# Patient Record
Sex: Female | Born: 1940 | Race: Black or African American | Hispanic: No | Smoking: Former smoker
Health system: Southern US, Community
[De-identification: ages and names within clinical notes are randomized; demographics above are authoritative.]

## PROBLEM LIST (undated history)

## (undated) DIAGNOSIS — C189 Malignant neoplasm of colon, unspecified: Secondary | ICD-10-CM

## (undated) DIAGNOSIS — N183 Chronic kidney disease, stage 3 unspecified: Secondary | ICD-10-CM

## (undated) DIAGNOSIS — E119 Type 2 diabetes mellitus without complications: Secondary | ICD-10-CM

## (undated) DIAGNOSIS — E875 Hyperkalemia: Secondary | ICD-10-CM

## (undated) DIAGNOSIS — I10 Essential (primary) hypertension: Secondary | ICD-10-CM

## (undated) DIAGNOSIS — M1990 Unspecified osteoarthritis, unspecified site: Secondary | ICD-10-CM

## (undated) HISTORY — PX: ABDOMINAL HYSTERECTOMY: SHX81

## (undated) HISTORY — DX: Type 2 diabetes mellitus without complications: E11.9

## (undated) HISTORY — DX: Chronic kidney disease, stage 3 (moderate): N18.3

## (undated) HISTORY — PX: CATARACT EXTRACTION, BILATERAL: SHX1313

## (undated) HISTORY — DX: Chronic kidney disease, stage 3 unspecified: N18.30

## (undated) HISTORY — DX: Hyperkalemia: E87.5

## (undated) HISTORY — PX: COLON SURGERY: SHX602

## (undated) HISTORY — PX: ANKLE FRACTURE SURGERY: SHX122

## (undated) HISTORY — DX: Malignant neoplasm of colon, unspecified: C18.9

## (undated) HISTORY — DX: Unspecified osteoarthritis, unspecified site: M19.90

---

## 2011-04-06 ENCOUNTER — Encounter (HOSPITAL_COMMUNITY): Payer: Self-pay | Admitting: *Deleted

## 2011-04-06 ENCOUNTER — Ambulatory Visit (HOSPITAL_COMMUNITY)
Admission: RE | Admit: 2011-04-06 | Discharge: 2011-04-06 | Disposition: A | Discharge status: Home / Self Care | Payer: Medicare Other | Source: Ambulatory Visit | Attending: Ophthalmology | Admitting: Ophthalmology

## 2011-04-06 ENCOUNTER — Encounter (HOSPITAL_COMMUNITY)
Admission: RE | Disposition: A | Discharge status: Home / Self Care | Payer: Self-pay | Source: Ambulatory Visit | Attending: Ophthalmology

## 2011-04-06 DIAGNOSIS — H26499 Other secondary cataract, unspecified eye: Secondary | ICD-10-CM | POA: Insufficient documentation

## 2011-04-06 DIAGNOSIS — I10 Essential (primary) hypertension: Secondary | ICD-10-CM | POA: Insufficient documentation

## 2011-04-06 DIAGNOSIS — E119 Type 2 diabetes mellitus without complications: Secondary | ICD-10-CM | POA: Insufficient documentation

## 2011-04-06 HISTORY — DX: Essential (primary) hypertension: I10

## 2011-04-06 SURGERY — TREATMENT, USING YAG LASER
Anesthesia: LOCAL | Laterality: Right

## 2011-04-06 MED ORDER — TETRACAINE HCL 0.5 % OP SOLN
OPHTHALMIC | Status: AC
  Filled 2011-04-06: qty 2

## 2011-04-06 MED ORDER — TROPICAMIDE 1 % OP SOLN
OPHTHALMIC | Status: AC
  Filled 2011-04-06: qty 3

## 2011-04-06 MED ORDER — TETRACAINE HCL 0.5 % OP SOLN: 1.0000 [drp] | OPHTHALMIC | Status: DC

## 2011-04-06 MED ORDER — APRACLONIDINE HCL 1 % OP SOLN: 1.0000 [drp] | OPHTHALMIC | Status: AC

## 2011-04-06 MED ORDER — APRACLONIDINE HCL 0.5 % OP SOLN
OPHTHALMIC | Status: AC
  Filled 2011-04-06: qty 5

## 2011-04-06 MED ORDER — TETRACAINE HCL 0.5 % OP SOLN: 1.0000 [drp] | Freq: Once | OPHTHALMIC | Status: DC

## 2011-04-06 MED ORDER — TROPICAMIDE 1 % OP SOLN: 1.0000 [drp] | OPHTHALMIC | Status: AC

## 2011-04-06 NOTE — Brief Op Note (Signed)
Tropicamide eye gtt , with iopidine i Gtt given---no scanner available

## 2011-04-06 NOTE — Brief Op Note (Signed)
Tropicamide and iopidine i gtt each given in right eye as ordered for 2nd dose

## 2011-04-06 NOTE — Brief Op Note (Signed)
3rd and last drops of tropicamide and iopidine eye gtts given to right eye.

## 2011-04-06 NOTE — H&P (Signed)
See scanned note.

## 2011-04-06 NOTE — Progress Notes (Signed)
Physical Therapy Feeding Evaluation    Patient Details:   Name: ZENAB GRONEWOLD DOB: 1941/01/19 MRN: [id number]  Infant Information:   Birth weight:  Today's weight:   Weight Change: Birth weight not on file  Gestational age at birth: Gestational Age: <None> Current gestational age: blank Apgar scores:  at 1 minute,  at 5 minutes. Delivery: .  Complications: .  Problems/History:   Past Medical History  Diagnosis Date  . Diabetes mellitus   . Hypertension          Objective Data:                       Assessment/Goals:      Plan/Recommendations:      Criteria for discharge: Patient will be discharge from therapy if treatment goals are met and no further needs are identified, if there is a change in medical status, if patient/family makes no progress toward goals in a reasonable time frame, or if patient is discharged from the hospital.  Leanna Battles F 04/06/2011, 10:44 AM

## 2011-06-15 ENCOUNTER — Encounter (HOSPITAL_COMMUNITY): Payer: Self-pay | Admitting: *Deleted

## 2011-06-15 ENCOUNTER — Ambulatory Visit (HOSPITAL_COMMUNITY)
Admission: RE | Admit: 2011-06-15 | Discharge: 2011-06-15 | Disposition: A | Discharge status: Home / Self Care | Payer: Medicare Other | Source: Ambulatory Visit | Attending: Ophthalmology | Admitting: Ophthalmology

## 2011-06-15 ENCOUNTER — Encounter (HOSPITAL_COMMUNITY)
Admission: RE | Disposition: A | Discharge status: Home / Self Care | Payer: Self-pay | Source: Ambulatory Visit | Attending: Ophthalmology

## 2011-06-15 SURGERY — TREATMENT, USING YAG LASER
Anesthesia: LOCAL

## 2011-06-15 MED ORDER — APRACLONIDINE HCL 1 % OP SOLN: 1.0000 [drp] | Freq: Once | OPHTHALMIC | Status: DC

## 2011-06-15 MED ORDER — TETRACAINE HCL 0.5 % OP SOLN
1.0000 [drp] | Freq: Once | OPHTHALMIC | Status: AC
  Administered 2011-06-15: 1 [drp] via OPHTHALMIC

## 2011-06-15 MED ORDER — TROPICAMIDE 1 % OP SOLN
1.0000 [drp] | OPHTHALMIC | Status: AC
  Administered 2011-06-15 (×3): 1 [drp] via OPHTHALMIC

## 2011-06-15 MED ORDER — TETRACAINE HCL 0.5 % OP SOLN
OPHTHALMIC | Status: AC
  Administered 2011-06-15: 1 [drp] via OPHTHALMIC
  Filled 2011-06-15: qty 2

## 2011-06-15 MED ORDER — APRACLONIDINE HCL 1 % OP SOLN
OPHTHALMIC | Status: AC
  Administered 2011-06-15: 1 [drp] via OPHTHALMIC
  Filled 2011-06-15: qty 0.1

## 2011-06-15 MED ORDER — TETRACAINE HCL 0.5 % OP SOLN: 1.0000 [drp] | Freq: Once | OPHTHALMIC | Status: DC

## 2011-06-15 MED ORDER — APRACLONIDINE HCL 1 % OP SOLN
1.0000 [drp] | OPHTHALMIC | Status: AC
  Administered 2011-06-15 (×3): 1 [drp] via OPHTHALMIC

## 2011-06-15 MED ORDER — TROPICAMIDE 1 % OP SOLN
OPHTHALMIC | Status: AC
  Administered 2011-06-15: 1 [drp] via OPHTHALMIC
  Filled 2011-06-15: qty 3

## 2011-06-15 NOTE — Progress Notes (Signed)
Dr. Lita Mains unable to perform YAG due to equipment failure. Laser needs to be calibrated.

## 2011-06-29 ENCOUNTER — Encounter (HOSPITAL_COMMUNITY): Payer: Self-pay | Admitting: *Deleted

## 2011-06-29 ENCOUNTER — Encounter (HOSPITAL_COMMUNITY)
Admission: RE | Disposition: A | Discharge status: Home / Self Care | Payer: Self-pay | Source: Ambulatory Visit | Attending: Ophthalmology

## 2011-06-29 ENCOUNTER — Ambulatory Visit (HOSPITAL_COMMUNITY)
Admission: RE | Admit: 2011-06-29 | Discharge: 2011-06-29 | Disposition: A | Discharge status: Home / Self Care | Payer: Medicare Other | Source: Ambulatory Visit | Attending: Ophthalmology | Admitting: Ophthalmology

## 2011-06-29 DIAGNOSIS — H26499 Other secondary cataract, unspecified eye: Secondary | ICD-10-CM | POA: Insufficient documentation

## 2011-06-29 SURGERY — TREATMENT, USING YAG LASER
Anesthesia: LOCAL | Laterality: Left

## 2011-06-29 MED ORDER — TETRACAINE HCL 0.5 % OP SOLN
1.0000 [drp] | Freq: Once | OPHTHALMIC | Status: AC
  Administered 2011-06-29: 1 [drp] via OPHTHALMIC

## 2011-06-29 MED ORDER — TROPICAMIDE 1 % OP SOLN
1.0000 [drp] | OPHTHALMIC | Status: AC
  Administered 2011-06-29 (×3): 1 [drp] via OPHTHALMIC

## 2011-06-29 MED ORDER — TROPICAMIDE 1 % OP SOLN
OPHTHALMIC | Status: AC
  Administered 2011-06-29: 1 [drp] via OPHTHALMIC
  Filled 2011-06-29: qty 3

## 2011-06-29 MED ORDER — TETRACAINE HCL 0.5 % OP SOLN
OPHTHALMIC | Status: AC
  Administered 2011-06-29: 1 [drp] via OPHTHALMIC
  Filled 2011-06-29: qty 2

## 2011-06-29 MED ORDER — APRACLONIDINE HCL 1 % OP SOLN
1.0000 [drp] | OPHTHALMIC | Status: AC
  Administered 2011-06-29 (×3): 1 [drp] via OPHTHALMIC

## 2011-06-29 MED ORDER — APRACLONIDINE HCL 1 % OP SOLN
OPHTHALMIC | Status: AC
  Administered 2011-06-29: 1 [drp] via OPHTHALMIC
  Filled 2011-06-29: qty 0.1

## 2011-06-29 MED ORDER — LISINOPRIL 10 MG PO TABS
20.0000 mg | ORAL_TABLET | Freq: Every day | ORAL | Status: DC
  Filled 2011-06-29: qty 1

## 2011-06-29 NOTE — H&P (Signed)
See scanned H&P

## 2011-06-29 NOTE — Brief Op Note (Signed)
               Jennifer Anthony 06/29/2011  [name]  Yag Laser Self Test Completedyes. Procedure: Posterior Capsulotomy, left eye.  Eye Protection Worn by Staff yes. Laser In Use Sign on Door yes.  Laser: Nd:YAG Spot Size: Fixed Burst Mode: III Power Setting:  1.8 - 2.2  mJ/burst Position treated: post capsule centrally Number of shots:  27 Total energy delivered: 52.2 mJ  Patency of the peripheral iridotomy was confirmed visually.  The patient tolerated the procedure without difficulty. No complications were encountered.  Tenometer reading immediately after procedure: 12 mmHg.  The patient was discharged home with the instructions to continue all her current glaucoma medications, if any.   Patient verbalizes understanding of discharge instructions yes.   Notes:previous lens strikes noted.  procedure completed today without complication.  Pt tolerated procedure well

## 2011-06-29 NOTE — H&P (Signed)
Pt interviewed and examined. There were no significant changes noted since original H&P completed.

## 2011-07-06 NOTE — Progress Notes (Addendum)
No progress note required- pt was outpatient See brief op note for laser procedure

## 2011-07-20 NOTE — Op Note (Signed)
Kataya CASSARA NIDA 07/20/2011  [name]  Yag Laser Self Test Completedyes. Procedure: Posterior Capsulotomy, left eye.  Eye Protection Worn by Staff yes. Laser In Use Sign on Door yes.  Laser: Nd:YAG Spot Size: Fixed Burst Mode: III Power Setting: see nurses notes mJ/burst Position treated: posterior capsule o'clock position Number of shots:   See nurses notes Total energy delivered: see nurses notes mJ  Patency of the peripheral iridotomy was confirmed visually.  The patient tolerated the procedure without difficulty. No complications were encountered.  Tenometer reading immediately after procedure: see nurses notes mmHg.  The patient was discharged home with the instructions to continue all her current glaucoma medications, if any.   Patient verbalizes understanding of discharge instructions yes.   Notes:f/u at [facility] per office notes

## 2011-07-20 NOTE — Op Note (Signed)
Jennifer Anthony  06/29/2011  [name]  Yag Laser Self Test Completedyes.  Procedure: Posterior Capsulotomy, left eye.  Eye Protection Worn by Staff yes.  Laser In Use Sign on Door yes.  Laser: Nd:YAG  Spot Size: Fixed  Burst Mode: III  Power Setting: 1.8 - 2.2 mJ/burst  Position treated: post capsule centrally  Number of shots: 27  Total energy delivered: 52.2 mJ  Patency of the peripheral iridotomy was confirmed visually.  The patient tolerated the procedure without difficulty. No complications were encountered.  Tenometer reading immediately after procedure: 12 mmHg.  The patient was discharged home with the instructions to continue all her current glaucoma medications, if any.  Patient verbalizes understanding of discharge instructions yes.  Notes:previous lens strikes noted. procedure completed today without complication. Pt tolerated procedure well

## 2017-02-03 ENCOUNTER — Ambulatory Visit (HOSPITAL_COMMUNITY): Payer: Medicare Other

## 2017-02-04 ENCOUNTER — Encounter (HOSPITAL_COMMUNITY): Payer: Self-pay

## 2017-02-04 ENCOUNTER — Ambulatory Visit (HOSPITAL_COMMUNITY)
Admission: RE | Admit: 2017-02-04 | Discharge: 2017-02-04 | Disposition: A | Discharge status: Home / Self Care | Payer: Medicare Other | Source: Ambulatory Visit | Attending: Oncology | Admitting: Oncology

## 2017-02-04 ENCOUNTER — Encounter (HOSPITAL_COMMUNITY): Payer: Medicare HMO | Attending: Oncology | Admitting: Oncology

## 2017-02-04 DIAGNOSIS — J8410 Pulmonary fibrosis, unspecified: Secondary | ICD-10-CM | POA: Diagnosis not present

## 2017-02-04 DIAGNOSIS — D472 Monoclonal gammopathy: Secondary | ICD-10-CM | POA: Insufficient documentation

## 2017-02-04 DIAGNOSIS — I10 Essential (primary) hypertension: Secondary | ICD-10-CM | POA: Diagnosis not present

## 2017-02-04 DIAGNOSIS — E119 Type 2 diabetes mellitus without complications: Secondary | ICD-10-CM

## 2017-02-04 NOTE — Patient Instructions (Signed)
[facility] at [facility] Discharge Instructions  RECOMMENDATIONS MADE BY THE CONSULTANT AND ANY TEST RESULTS WILL BE SENT TO YOUR REFERRING PHYSICIAN.  You saw Dr. Zhou today.  Thank you for choosing [facility] at [facility] to provide your oncology and hematology care.  To afford each patient quality time with our provider, please arrive at least 15 minutes before your scheduled appointment time.    If you have a lab appointment with the Cancer Center please come in thru the  Main Entrance and check in at the main information desk  You need to re-schedule your appointment should you arrive 10 or more minutes late.  We strive to give you quality time with our providers, and arriving late affects you and other patients whose appointments are after yours.  Also, if you no show three or more times for appointments you may be dismissed from the clinic at the providers discretion.     Again, thank you for choosing [facility].  Our hope is that these requests will decrease the amount of time that you wait before being seen by our physicians.       _____________________________________________________________  Should you have questions after your visit to [facility], please contact our office at [phone] between the hours of 8:30 a.m. and 4:30 p.m.  Voicemails left after 4:30 p.m. will not be returned until the following business day.  For prescription refill requests, have your pharmacy contact our office.       Resources For Cancer Patients and their Caregivers ? American Cancer Society: Can assist with transportation, wigs, general needs, runs Look Good Feel Better.        1-888-227-6333 ? Cancer Care: Provides financial assistance, online support groups, medication/co-pay assistance.  1-800-813-HOPE (4673) ? [facility] Assists [place] cancer patients and their families through  emotional , educational and financial support.  336-427-4357 ? [place] DSS Where to apply for food stamps, Medicaid and utility assistance. 336-342-1394 ? [facility]: Transportation to medical appointments. 336-347-2287 ? Social Security Administration: May apply for disability if have a Stage IV cancer. 336-342-7796 1-800-772-1213 ? [place] Aging, Disability and Transit Services: Assists with nutrition, care and transit needs. 336-349-2343  Cancer Center Support Programs: @10RELATIVEDAYS@ > Cancer Support Group  2nd Tuesday of the month 1pm-2pm, Journey Room  > Creative Journey  3rd Tuesday of the month 1130am-1pm, Journey Room  > Look Good Feel Better  1st Wednesday of the month 10am-12 noon, Journey Room (Call American Cancer Society to register 1-800-395-5775)    

## 2017-02-04 NOTE — Progress Notes (Signed)
[facility] Initial Visit:  Patient Care Team: Monico Blitz, MD as PCP - General (Internal Medicine)  CHIEF COMPLAINTS/PURPOSE OF CONSULTATION:  IgG kappa monoclonal paraprotein.  HISTORY OF PRESENTING ILLNESS: Jennifer Anthony 76 y.o. 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 As a part of her workup for the underlying etiology of her chronic kidney disease, patient had a SPEP performed on 01/08/17 which demonstrated a monoclonal M spike of 0.3 g/dL. Immunofixation shows IgG monoclonal protein with kappa Light chain specificity. Free kappa light chains 90.2, free lambda light chains to 0.9, kappa/lambda light chain ratio 4.32. Total protein 6.4. CMP from 01/05/17 demonstrated creatinine 1.22, calcium elevated at 10.5. CBC from 01/05/17 demonstrated hemoglobin 10.4, hematocrit 33.6%. Overall patient states that she feels well and has no complaints today. She denies any chronic bone pains, fatigue, chest pain, shortness breath, abdominal pain, generalized weakness, recent infections.  Review of Systems  Constitutional: Negative for appetite change, chills, fatigue and fever.  HENT:   Negative for hearing loss, lump/mass, mouth sores, sore throat and tinnitus.   Eyes: Negative for eye problems and icterus.  Respiratory: Negative for chest tightness, cough, hemoptysis, shortness of breath and wheezing.   Cardiovascular: Negative for chest pain, leg swelling and palpitations.  Gastrointestinal: Negative for abdominal distention, abdominal pain, blood in stool, diarrhea, nausea and vomiting.  Endocrine: Negative.  Negative for hot flashes.  Genitourinary: Negative for difficulty urinating, frequency and hematuria.   Musculoskeletal: Negative for arthralgias and neck pain.  Skin: Negative for itching and rash.  Neurological: Negative for dizziness, headaches and speech difficulty.  Hematological: Negative for adenopathy. Does not  bruise/bleed easily.  Psychiatric/Behavioral: Negative for confusion. The patient is not nervous/anxious.     MEDICAL HISTORY: Past Medical History:  Diagnosis Date  . Diabetes mellitus   . Hypertension     SURGICAL HISTORY: Past Surgical History:  Procedure Laterality Date  . ABDOMINAL HYSTERECTOMY    . ANKLE FRACTURE SURGERY    . CATARACT EXTRACTION, BILATERAL    . COLON SURGERY      SOCIAL HISTORY: Social History   Social History  . Marital status: Married    Spouse name: N/A  . Number of children: N/A  . Years of education: N/A   Occupational History  . Not on file.   Social History Main Topics  . Smoking status: Never Smoker  . Smokeless tobacco: Never Used  . Alcohol use No  . Drug use: No  . Sexual activity: Not on file     Comment: widowed   Other Topics Concern  . Not on file   Social History Narrative  . No narrative on file    FAMILY HISTORY History reviewed. No pertinent family history.  ALLERGIES:  is allergic to ampicillin.  MEDICATIONS:  Current Outpatient Prescriptions  Medication Sig Dispense Refill  . aspirin 81 MG tablet Take 81 mg by mouth daily.      . insulin detemir (LEVEMIR) 100 UNIT/ML injection Inject 15 Units into the skin 2 (two) times daily as needed. For high sugar    . insulin lispro (HUMALOG) 100 UNIT/ML injection Inject 6 Units into the skin 2 (two) times daily with a meal.      . lisinopril (PRINIVIL,ZESTRIL) 20 MG tablet Take 30 mg by mouth daily.       No current facility-administered medications for this visit.     PHYSICAL EXAMINATION:  ECOG PERFORMANCE STATUS: 0 - Asymptomatic   Vitals:  02/04/17 1312  BP: (!) 147/64  Pulse: 68  Resp: 18  Temp: 97.6 F (36.4 C)    Filed Weights   02/04/17 1312  Weight: 166 lb 1.6 oz (75.3 kg)     Physical Exam  Constitutional: She is oriented to person, place, and time and well-developed, well-nourished, and in no distress. No distress.  HENT:  Head:  Normocephalic and atraumatic.  Mouth/Throat: No oropharyngeal exudate.  Eyes: Conjunctivae are normal. Pupils are equal, round, and reactive to light. No scleral icterus.  Neck: Normal range of motion. Neck supple. No JVD present.  Cardiovascular: Normal rate, regular rhythm and normal heart sounds.  Exam reveals no gallop and no friction rub.   No murmur heard. Pulmonary/Chest: Breath sounds normal. No respiratory distress. She has no wheezes. She has no rales.  Abdominal: Soft. Bowel sounds are normal. She exhibits no distension. There is no tenderness. There is no guarding.  Musculoskeletal: She exhibits no tenderness.  Left ankle edema-chronic after she broke her ankle  Lymphadenopathy:    She has no cervical adenopathy.  Neurological: She is alert and oriented to person, place, and time. No cranial nerve deficit.  Skin: Skin is warm and dry. No rash noted. No erythema. No pallor.  Psychiatric: Affect and judgment normal.     LABORATORY DATA: I have personally reviewed the data in her medical records.   RADIOGRAPHIC STUDIES: I have personally reviewed the radiological images as listed and agree with the findings in the report  No results found.  ASSESSMENT/PLAN IgG Kappa monoclonal paraprotein  PLAN: I have discussed bone marrow biopsy with the patient to assess the % plasma cells in her bone marrow to r/o multiple myeloma. Patient states she wants this to be done next month in mid July due to issues with her finances and copays. I will set up a CT guided bone marrow by IR next month.  RTC in the first week of august to review her bone marrow biopsy results and also repeat her myeloma labwork.   Orders Placed This Encounter  Procedures  . DG Bone Survey Met    Standing Status:   Future    Number of Occurrences:   1    Standing Expiration Date:   04/06/2018    Order Specific Question:   Reason for Exam (SYMPTOM  OR DIAGNOSIS REQUIRED)    Answer:   monoclonal gammopathy, r/o  bone mets    Order Specific Question:   Preferred imaging location?    Answer:   [facility]    Order Specific Question:   Radiology Contrast Protocol - do NOT remove file path    Answer:   \\[email or web address]\DXFluoroContrastProtocols.pdf  . CT Biopsy    Standing Status:   Future    Standing Expiration Date:   02/04/2018    Order Specific Question:   Lab orders requested (DO NOT place separate lab orders, these will be automatically ordered during procedure specimen collection):    Answer:   Other    Comments:   surgical path, flow cytometry, cytogenetics    Order Specific Question:   Reason for Exam (SYMPTOM  OR DIAGNOSIS REQUIRED)    Answer:   IgG kappa monoclonal paraprotein    Order Specific Question:   Preferred imaging location?    Answer:   [facility]    Order Specific Question:   Radiology Contrast Protocol - do NOT remove file path    Answer:   \\[email or web address]\Radiant\CTProtocols.pdf  . Multiple Myeloma Panel (SPEP&IFE w/QIG)  Standing Status:   Future    Standing Expiration Date:   02/04/2018  . Kappa/lambda light chains    Standing Status:   Future    Standing Expiration Date:   02/04/2018  . Beta 2 microglobuline, serum    Standing Status:   Future    Standing Expiration Date:   02/04/2018  . CBC with Differential    Standing Status:   Future    Standing Expiration Date:   02/04/2018  . Comprehensive metabolic panel    Standing Status:   Future    Standing Expiration Date:   02/04/2018    All questions were answered. The patient knows to call the clinic with any problems, questions or concerns.  This note was electronically signed.    Twana First, MD  02/04/2017 2:02 PM

## 2017-03-15 ENCOUNTER — Ambulatory Visit (HOSPITAL_COMMUNITY): Payer: Medicare Other

## 2017-03-26 ENCOUNTER — Other Ambulatory Visit: Payer: Self-pay | Admitting: Radiology

## 2017-03-29 ENCOUNTER — Ambulatory Visit (HOSPITAL_COMMUNITY): Admission: RE | Admit: 2017-03-29 | Payer: Medicare Other | Source: Ambulatory Visit

## 2017-04-06 ENCOUNTER — Other Ambulatory Visit (HOSPITAL_COMMUNITY): Payer: Medicare HMO

## 2017-04-06 ENCOUNTER — Ambulatory Visit (HOSPITAL_COMMUNITY): Payer: Medicare HMO

## 2018-05-13 ENCOUNTER — Encounter: Payer: Self-pay | Admitting: "Endocrinology

## 2018-11-14 ENCOUNTER — Ambulatory Visit: Payer: Self-pay | Admitting: "Endocrinology

## 2018-11-23 ENCOUNTER — Ambulatory Visit: Payer: Self-pay | Admitting: "Endocrinology

## 2018-12-29 ENCOUNTER — Encounter: Payer: Self-pay | Admitting: "Endocrinology

## 2018-12-30 NOTE — Progress Notes (Signed)
This encounter was created in error - please disregard.

## 2019-01-10 ENCOUNTER — Other Ambulatory Visit: Payer: Self-pay

## 2019-01-10 ENCOUNTER — Ambulatory Visit (INDEPENDENT_AMBULATORY_CARE_PROVIDER_SITE_OTHER): Payer: Medicare Other | Admitting: "Endocrinology

## 2019-01-10 ENCOUNTER — Encounter: Payer: Self-pay | Admitting: "Endocrinology

## 2019-01-10 DIAGNOSIS — E213 Hyperparathyroidism, unspecified: Secondary | ICD-10-CM | POA: Diagnosis not present

## 2019-01-10 NOTE — Progress Notes (Signed)
Endocrinology consult Note       01/10/2019, 12:28 PM  Jennifer Anthony is a 78 y.o.-year-old female, referred by her  Monico Blitz, MD  , for evaluation for hypercalcemia/hyperparathyroidism.   Past Medical History:  Diagnosis Date  . Adenocarcinoma of colon ([place])   . CKD (chronic kidney disease), stage III ([place])   . Diabetes mellitus   . 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 Hyperkalemia   . Hypertension   . Hypertension   . Osteoarthritis     Past Surgical History:  Procedure Laterality Date  . ABDOMINAL HYSTERECTOMY    . ANKLE FRACTURE SURGERY    . CATARACT EXTRACTION, BILATERAL    . COLON SURGERY      Social History   Tobacco Use  . Smoking status: Former Smoker    Packs/day: 0.50    Years: 20.00    Pack years: 10.00    Types: Cigarettes    Last attempt to quit: 2000    Years since quitting: 20.3  . Smokeless tobacco: Never Used  Substance Use Topics  . Alcohol use: No  . Drug use: No    History reviewed. No pertinent family history.  Outpatient Encounter Medications as of 01/10/2019  Medication Sig  . aspirin 81 MG tablet Take 81 mg by mouth daily.    . [DISCONTINUED] cinacalcet (SENSIPAR) 60 MG tablet at bedtime.  . cinacalcet (SENSIPAR) 60 MG tablet Take 60 mg by mouth at bedtime.  . Insulin Detemir (LEVEMIR) 100 UNIT/ML Pen Inject 20 Units into the skin at bedtime.   . insulin lispro (HUMALOG) 100 UNIT/ML KwikPen Inject 10 Units into the skin 3 (three) times daily before meals.   . pravastatin (PRAVACHOL) 10 MG tablet Take 10 mg by mouth daily.  . VELTASSA 8.4 g packet daily.  . [DISCONTINUED] Cholecalciferol (VITAMIN D) 50 MCG (2000 UT) tablet Take 2,000 Units by mouth daily.   No facility-administered encounter medications on file as of 01/10/2019.     Allergies  Allergen Reactions  . Ampicillin Swelling  . Clindamycin/Lincomycin      HPI  Jennifer Anthony was diagnosed with  hypercalcemia for at least 2 years.  Patient has no previously known history of parathyroid, pituitary, adrenal dysfunctions; no family history of such dysfunctions.  -She has stage 3-4 renal insufficiency, following with Dr. Lowanda Foster her nephrologist.  She did have measurements of PTH which range from 72-1 30 over the course of 1 year, calcium ranged from 10.3-10.8.  No prior history of fragility fractures or falls. No history of  kidney stones. -She is on Sensipar 60 mg p.o. daily for at least 2 years. she is not on HCTZ or other thiazide therapy.  She has vitamin D deficiency, not currently on supplement.   she is not on calcium supplements,  she eats dairy and green, leafy, vegetables on average amounts.  she does not have a family history of hypercalcemia, pituitary tumors, thyroid cancer, or osteoporosis.  -She does not have documented 24-hour urine calcium.  No documented bone density.   ROS:  Constitutional: no weight gain/loss, no fatigue, no subjective hyperthermia, no subjective hypothermia Eyes: no blurry vision,  no xerophthalmia ENT: no sore throat, no nodules palpated in throat, no dysphagia/odynophagia, no hoarseness Cardiovascular: no Chest Pain, no Shortness of Breath, no palpitations, no leg swelling Respiratory: no cough, no shortness of breath  Gastrointestinal: no Nausea/Vomiting/Diarhhea Musculoskeletal: no muscle/joint aches Skin: no rashes Neurological: no tremors, no numbness, no tingling, no dizziness Psychiatric: no depression, no anxiety  PE: BP 137/67   Pulse 77   Ht 5\' 9"  (1.753 m)   Wt 162 lb (73.5 kg)   BMI 23.92 kg/m , Body mass index is 23.92 kg/m. Wt Readings from Last 3 Encounters:  01/10/19 162 lb (73.5 kg)  02/04/17 166 lb 1.6 oz (75.3 kg)    Constitutional:  + Appropriate weight for height, not in acute distress, normal state of mind Constitutional:  not in acute distress, normal state of mind Eyes:  EOMI, no exophthalmos Neck:  Supple Respiratory: Adequate breathing efforts Musculoskeletal: no gross deformities, strength intact in all four extremities Skin:  no rashes, no hyperemia Neurological: no tremor with outstretched hands.  September 02, 2018 BUN 24 creatinine 1.39 GFR 45, PTH 130 previously 72.  Vitamin D 19.2 calcium 10.6.  Previous calcium measurements 10.6, 10.3, 10.8 Assessment: 1. Hypercalcemia / Hyperparathyroidism  Plan: Patient has had several instances of elevated calcium, with the highest level being at 10.8 mg/dL. A corresponding intact PTH level was also high, at 130.  - Patient also  has vitamin D deficiency,  with the last level being 19.2.  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  Her maximum calcium level of 10.8 will not warrant immediate intervention.  However, patient is already on Sensipar 60 mg p.o. daily, advised to continue. - No apparent complications from hypercalcemia/hyperparathyroidism: no history of  nephrolithiasis,  osteoporosis,fragility fractures. No abdominal pain, no major mood disorders, no bone pain.  - I discussed with the patient about the physiology of calcium and parathyroid hormone, and possible  effects of  increased PTH/ Calcium , including kidney stones, cardiac dysrhythmias, osteoporosis, abdominal pain, etc.   - The work up so far is not sufficient to reach a conclusion for definitive therapy.  she  needs more studies to confirm and classify the parathyroid dysfunction she may have. I will proceed to obtain parathyroid scan.   It is also essential to obtain 24-hour urine calcium/creatinine to rule out the rare but important cause of mild elevation in calcium and PTH- [place] ( Familial Hypocalciuric Hypercalcemia), which may not require any active intervention.  -On her subsequent visits, I will request for her next DEXA scan to include the distal  33% of  radius  for evaluation of cortical bone, which is predominantly affected by hyperparathyroidism.    - Time spent with the patient: 45 minutes, of which >50% was spent in obtaining information about her symptoms, reviewing her previous labs, evaluations, and treatments, counseling her about her hyperparathyroidism, and developing a plan to confirm the diagnosis and long term treatment as necessary.  Please refer to " Patient Self Inventory" in the Media  tab for reviewed elements of pertinent patient history.  Albina Billet participated in the discussions, expressed understanding, and voiced agreement with the above plans.  All questions were answered to her satisfaction. she is encouraged to contact clinic should she have any questions or concerns prior to her return visit.  - Return in about 2 weeks (around 01/24/2019), or Parathyroid scan, for Follow up with Pre-visit Labs,  24 Hours Urine Calcium and Creatinine.   Glade Lloyd, MD [facility] [address] [place], [place] [address] Phone: [phone]  Fax: [phone]    This note was partially dictated with voice recognition software. Similar sounding words can be transcribed inadequately or may not  be corrected upon review.  01/10/2019, 12:28 PM

## 2019-01-12 ENCOUNTER — Other Ambulatory Visit: Payer: Self-pay | Admitting: "Endocrinology

## 2019-01-13 LAB — CREATININE, URINE, 24 HOUR: Creatinine, 24H Ur: 0.73 g/(24.h) (ref 0.50–2.15)

## 2019-01-13 LAB — CALCIUM, URINE, 24 HOUR: Calcium, 24H Urine: 18 mg/24 h — ABNORMAL LOW

## 2019-01-31 ENCOUNTER — Ambulatory Visit (INDEPENDENT_AMBULATORY_CARE_PROVIDER_SITE_OTHER): Payer: Medicare Other | Admitting: "Endocrinology

## 2019-01-31 ENCOUNTER — Encounter: Payer: Self-pay | Admitting: "Endocrinology

## 2019-01-31 ENCOUNTER — Other Ambulatory Visit: Payer: Self-pay

## 2019-01-31 VITALS — BP 161/75 | Temp 98.3°F | Ht 69.0 in | Wt 168.0 lb

## 2019-01-31 DIAGNOSIS — E213 Hyperparathyroidism, unspecified: Secondary | ICD-10-CM | POA: Diagnosis not present

## 2019-01-31 NOTE — Progress Notes (Signed)
Endocrinology f/u Note       01/31/2019, 1:23 PM  Jennifer Anthony is a 78 y.o.-year-old female, referred by her  Monico Blitz, MD  , for evaluation for hypercalcemia/hyperparathyroidism.   Past Medical History:  Diagnosis Date  . Adenocarcinoma of colon ([place])   . CKD (chronic kidney disease), stage III ([place])   . Diabetes mellitus   . Diabetes mellitus, type II ([place])   . Hyperkalemia   . Hypertension   . Hypertension   . Osteoarthritis     Past Surgical History:  Procedure Laterality Date  . ABDOMINAL HYSTERECTOMY    . ANKLE FRACTURE SURGERY    . CATARACT EXTRACTION, BILATERAL    . COLON SURGERY      Social History   Tobacco Use  . Smoking status: Former Smoker    Packs/day: 0.50    Years: 20.00    Pack years: 10.00    Types: Cigarettes    Last attempt to quit: 2000    Years since quitting: 20.4  . Smokeless tobacco: Never Used  Substance Use Topics  . Alcohol use: No  . Drug use: No    History reviewed. No pertinent family history.  Outpatient Encounter Medications as of 01/31/2019  Medication Sig  . amLODipine (NORVASC) 2.5 MG tablet Take 2.5 mg by mouth daily.  Marland Kitchen aspirin 81 MG tablet Take 81 mg by mouth daily.    . cinacalcet (SENSIPAR) 60 MG tablet Take 60 mg by mouth at bedtime.  . Insulin Detemir (LEVEMIR) 100 UNIT/ML Pen Inject 20 Units into the skin at bedtime.   . insulin lispro (HUMALOG) 100 UNIT/ML KwikPen Inject 10 Units into the skin 3 (three) times daily before meals.   . pravastatin (PRAVACHOL) 10 MG tablet Take 10 mg by mouth daily.  . VELTASSA 8.4 g packet daily.   No facility-administered encounter medications on file as of 01/31/2019.     Allergies  Allergen Reactions  . Ampicillin Swelling  . Clindamycin/Lincomycin      HPI  Jennifer Anthony was diagnosed with hypercalcemia for at least 2 years.  Patient has no previously known history of parathyroid, pituitary, adrenal  dysfunctions; no family history of such dysfunctions.  -She has stage 3-4 renal insufficiency, following with Dr. Lowanda Foster her nephrologist.  She did have measurements of PTH which range from 72-1 30 over the course of 1 year, calcium ranged from 10.3-10.8. -Her recent 24-hour urine calcium calcium is low at 18 milligrams per 24 hours. No prior history of fragility fractures or falls. No history of  kidney stones. -She is on Sensipar 60 mg p.o. daily for at least 2 years. she is not on HCTZ or other thiazide therapy.  She has vitamin D deficiency, not currently on supplement.   she is not on calcium supplements,  she eats dairy and green, leafy, vegetables on average amounts.  she does not have a family history of hypercalcemia, pituitary tumors, thyroid cancer, or osteoporosis.  -She does not have documented 24-hour urine calcium.  No documented bone density.   ROS:  Constitutional: no weight gain/loss, no fatigue, no subjective hyperthermia, no subjective hypothermia Eyes: no blurry vision, no  xerophthalmia ENT: no sore throat, no nodules palpated in throat, no dysphagia/odynophagia, no hoarseness Cardiovascular: no Chest Pain, no Shortness of Breath, no palpitations, no leg swelling Respiratory: no cough, no shortness of breath  Gastrointestinal: no Nausea/Vomiting/Diarhhea Musculoskeletal: no muscle/joint aches Skin: no rashes Neurological: no tremors, no numbness, no tingling, no dizziness Psychiatric: no depression, no anxiety  PE: BP (!) 161/75   Temp 98.3 F (36.8 C)   Ht 5\' 9"  (1.753 m)   Wt 168 lb (76.2 kg)   BMI 24.81 kg/m , Body mass index is 24.81 kg/m. Wt Readings from Last 3 Encounters:  01/31/19 168 lb (76.2 kg)  01/10/19 162 lb (73.5 kg)  02/04/17 166 lb 1.6 oz (75.3 kg)    Constitutional:  not in acute distress, normal state of mind Eyes:  EOMI, no exophthalmos Neck: Supple Respiratory: Adequate breathing efforts Musculoskeletal: no gross deformities,  strength intact in all four extremities Skin:  no rashes, no hyperemia Neurological: no tremor with outstretched hands.   September 02, 2018 BUN 24 creatinine 1.39 GFR 45, PTH 130 previously 72.  Vitamin D 19.2 calcium 10.6.  Previous calcium measurements 10.6, 10.3, 10.8  Recent Results (from the past 2160 hour(s))  Creatinine, urine, 24 hour     Status: None   Collection Time: 01/12/19  2:05 PM  Result Value Ref Range   Creatinine, 24H Ur 0.73 0.50 - 2.15 g/24 h  Calcium, urine, 24 hour     Status: Abnormal   Collection Time: 01/12/19  2:05 PM  Result Value Ref Range   Calcium, 24H Urine 18 (L) mg/24 h    Comment:                           Reference Range  35-250                            Low calcium diet 35-200     Assessment: 1. Hypercalcemia / Hyperparathyroidism  Plan: Patient has had several instances of elevated calcium, with the highest level being at 10.8 mg/dL. A corresponding intact PTH level was also high, at 130.  - Patient also  has vitamin D deficiency,  with the last level being 19.2.  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  Her maximum calcium level of 10.8 will not warrant immediate intervention.    However, patient is already on Sensipar 60 mg p.o. daily, advised to continue. -She was supposed to complete parathyroid scan, this is not completed yet. -Per her 24-hour urine calcium being low at 18, she is not a surgical candidate for now.  -On her subsequent visits, I will request for her next DEXA scan to include the distal  33% of  radius for evaluation of cortical bone, which is predominantly affected by hyperparathyroidism.   She will return in 15 days with her parathyroid scan to review and discuss treatment if necessary.  -Time for visit 15 minutes  Jennifer Anthony participated in the discussions, expressed understanding, and voiced agreement  with the above plans.  All questions were answered to her satisfaction. she is encouraged to contact clinic should she have any questions or concerns prior to her return visit.  - Return in about 2 weeks (around 02/14/2019), or after her parathyroid scan.   Glade Lloyd, MD [facility] [phone]  [address] [place], [place] [address] Phone: [phone]  Fax: [phone]    This note was partially dictated with voice recognition software. Similar sounding words can be transcribed inadequately or may not  be corrected upon review.  01/31/2019, 1:23 PM

## 2019-02-07 ENCOUNTER — Encounter (HOSPITAL_COMMUNITY): Payer: Medicare Other

## 2019-02-14 ENCOUNTER — Ambulatory Visit: Payer: Medicare Other | Admitting: "Endocrinology

## 2019-03-16 ENCOUNTER — Other Ambulatory Visit: Payer: Self-pay

## 2019-03-16 ENCOUNTER — Encounter (HOSPITAL_COMMUNITY)
Admission: RE | Admit: 2019-03-16 | Discharge: 2019-03-16 | Disposition: A | Discharge status: Home / Self Care | Payer: Medicare Other | Source: Ambulatory Visit | Attending: "Endocrinology | Admitting: "Endocrinology

## 2019-03-16 DIAGNOSIS — E213 Hyperparathyroidism, unspecified: Secondary | ICD-10-CM | POA: Diagnosis present

## 2019-03-16 MED ORDER — TECHNETIUM TC 99M SESTAMIBI GENERIC - CARDIOLITE
25.0000 | Freq: Once | INTRAVENOUS | Status: AC | PRN
  Administered 2019-03-16: 24.7 via INTRAVENOUS

## 2019-09-22 DIAGNOSIS — Z6824 Body mass index (BMI) 24.0-24.9, adult: Secondary | ICD-10-CM | POA: Diagnosis not present

## 2019-09-22 DIAGNOSIS — J449 Chronic obstructive pulmonary disease, unspecified: Secondary | ICD-10-CM | POA: Diagnosis not present

## 2019-09-22 DIAGNOSIS — E1122 Type 2 diabetes mellitus with diabetic chronic kidney disease: Secondary | ICD-10-CM | POA: Diagnosis not present

## 2019-09-22 DIAGNOSIS — E1165 Type 2 diabetes mellitus with hyperglycemia: Secondary | ICD-10-CM | POA: Diagnosis not present

## 2019-09-22 DIAGNOSIS — Z299 Encounter for prophylactic measures, unspecified: Secondary | ICD-10-CM | POA: Diagnosis not present

## 2019-09-22 DIAGNOSIS — I10 Essential (primary) hypertension: Secondary | ICD-10-CM | POA: Diagnosis not present

## 2019-09-22 DIAGNOSIS — Z87891 Personal history of nicotine dependence: Secondary | ICD-10-CM | POA: Diagnosis not present

## 2019-09-26 DIAGNOSIS — L110 Acquired keratosis follicularis: Secondary | ICD-10-CM | POA: Diagnosis not present

## 2019-09-26 DIAGNOSIS — I739 Peripheral vascular disease, unspecified: Secondary | ICD-10-CM | POA: Diagnosis not present

## 2019-09-26 DIAGNOSIS — M79672 Pain in left foot: Secondary | ICD-10-CM | POA: Diagnosis not present

## 2019-09-26 DIAGNOSIS — E1140 Type 2 diabetes mellitus with diabetic neuropathy, unspecified: Secondary | ICD-10-CM | POA: Diagnosis not present

## 2019-09-26 DIAGNOSIS — M79671 Pain in right foot: Secondary | ICD-10-CM | POA: Diagnosis not present

## 2019-09-29 DIAGNOSIS — H33321 Round hole, right eye: Secondary | ICD-10-CM | POA: Diagnosis not present

## 2019-09-29 DIAGNOSIS — E103293 Type 1 diabetes mellitus with mild nonproliferative diabetic retinopathy without macular edema, bilateral: Secondary | ICD-10-CM | POA: Diagnosis not present

## 2019-09-29 DIAGNOSIS — I10 Essential (primary) hypertension: Secondary | ICD-10-CM | POA: Diagnosis not present

## 2019-09-29 DIAGNOSIS — E119 Type 2 diabetes mellitus without complications: Secondary | ICD-10-CM | POA: Diagnosis not present

## 2019-10-18 DIAGNOSIS — N189 Chronic kidney disease, unspecified: Secondary | ICD-10-CM | POA: Diagnosis not present

## 2019-10-18 DIAGNOSIS — E1122 Type 2 diabetes mellitus with diabetic chronic kidney disease: Secondary | ICD-10-CM | POA: Diagnosis not present

## 2019-10-18 DIAGNOSIS — D638 Anemia in other chronic diseases classified elsewhere: Secondary | ICD-10-CM | POA: Diagnosis not present

## 2019-10-18 DIAGNOSIS — E210 Primary hyperparathyroidism: Secondary | ICD-10-CM | POA: Diagnosis not present

## 2019-10-25 DIAGNOSIS — I129 Hypertensive chronic kidney disease with stage 1 through stage 4 chronic kidney disease, or unspecified chronic kidney disease: Secondary | ICD-10-CM | POA: Diagnosis not present

## 2019-10-25 DIAGNOSIS — E210 Primary hyperparathyroidism: Secondary | ICD-10-CM | POA: Diagnosis not present

## 2019-10-25 DIAGNOSIS — R809 Proteinuria, unspecified: Secondary | ICD-10-CM | POA: Diagnosis not present

## 2019-10-25 DIAGNOSIS — N189 Chronic kidney disease, unspecified: Secondary | ICD-10-CM | POA: Diagnosis not present

## 2019-11-03 DIAGNOSIS — N189 Chronic kidney disease, unspecified: Secondary | ICD-10-CM | POA: Diagnosis not present

## 2019-11-03 DIAGNOSIS — E1122 Type 2 diabetes mellitus with diabetic chronic kidney disease: Secondary | ICD-10-CM | POA: Diagnosis not present

## 2019-11-03 DIAGNOSIS — I129 Hypertensive chronic kidney disease with stage 1 through stage 4 chronic kidney disease, or unspecified chronic kidney disease: Secondary | ICD-10-CM | POA: Diagnosis not present

## 2019-11-03 DIAGNOSIS — R809 Proteinuria, unspecified: Secondary | ICD-10-CM | POA: Diagnosis not present

## 2019-11-03 DIAGNOSIS — E210 Primary hyperparathyroidism: Secondary | ICD-10-CM | POA: Diagnosis not present

## 2019-11-15 DIAGNOSIS — I10 Essential (primary) hypertension: Secondary | ICD-10-CM | POA: Diagnosis not present

## 2019-11-15 DIAGNOSIS — E119 Type 2 diabetes mellitus without complications: Secondary | ICD-10-CM | POA: Diagnosis not present

## 2019-12-15 DIAGNOSIS — I10 Essential (primary) hypertension: Secondary | ICD-10-CM | POA: Diagnosis not present

## 2019-12-15 DIAGNOSIS — E119 Type 2 diabetes mellitus without complications: Secondary | ICD-10-CM | POA: Diagnosis not present

## 2019-12-19 DIAGNOSIS — L110 Acquired keratosis follicularis: Secondary | ICD-10-CM | POA: Diagnosis not present

## 2019-12-19 DIAGNOSIS — M79671 Pain in right foot: Secondary | ICD-10-CM | POA: Diagnosis not present

## 2019-12-19 DIAGNOSIS — M79672 Pain in left foot: Secondary | ICD-10-CM | POA: Diagnosis not present

## 2019-12-19 DIAGNOSIS — E1140 Type 2 diabetes mellitus with diabetic neuropathy, unspecified: Secondary | ICD-10-CM | POA: Diagnosis not present

## 2019-12-19 DIAGNOSIS — I739 Peripheral vascular disease, unspecified: Secondary | ICD-10-CM | POA: Diagnosis not present

## 2019-12-27 DIAGNOSIS — M21969 Unspecified acquired deformity of unspecified lower leg: Secondary | ICD-10-CM | POA: Diagnosis not present

## 2019-12-27 DIAGNOSIS — I10 Essential (primary) hypertension: Secondary | ICD-10-CM | POA: Diagnosis not present

## 2019-12-27 DIAGNOSIS — Z87891 Personal history of nicotine dependence: Secondary | ICD-10-CM | POA: Diagnosis not present

## 2019-12-27 DIAGNOSIS — Z6824 Body mass index (BMI) 24.0-24.9, adult: Secondary | ICD-10-CM | POA: Diagnosis not present

## 2019-12-27 DIAGNOSIS — E1129 Type 2 diabetes mellitus with other diabetic kidney complication: Secondary | ICD-10-CM | POA: Diagnosis not present

## 2019-12-27 DIAGNOSIS — Z299 Encounter for prophylactic measures, unspecified: Secondary | ICD-10-CM | POA: Diagnosis not present

## 2019-12-27 DIAGNOSIS — E1165 Type 2 diabetes mellitus with hyperglycemia: Secondary | ICD-10-CM | POA: Diagnosis not present

## 2020-01-02 DIAGNOSIS — L110 Acquired keratosis follicularis: Secondary | ICD-10-CM | POA: Diagnosis not present

## 2020-01-02 DIAGNOSIS — M79671 Pain in right foot: Secondary | ICD-10-CM | POA: Diagnosis not present

## 2020-01-02 DIAGNOSIS — I739 Peripheral vascular disease, unspecified: Secondary | ICD-10-CM | POA: Diagnosis not present

## 2020-01-02 DIAGNOSIS — E1140 Type 2 diabetes mellitus with diabetic neuropathy, unspecified: Secondary | ICD-10-CM | POA: Diagnosis not present

## 2020-01-02 DIAGNOSIS — M79672 Pain in left foot: Secondary | ICD-10-CM | POA: Diagnosis not present

## 2020-01-17 DIAGNOSIS — E1122 Type 2 diabetes mellitus with diabetic chronic kidney disease: Secondary | ICD-10-CM | POA: Diagnosis not present

## 2020-01-17 DIAGNOSIS — I129 Hypertensive chronic kidney disease with stage 1 through stage 4 chronic kidney disease, or unspecified chronic kidney disease: Secondary | ICD-10-CM | POA: Diagnosis not present

## 2020-01-17 DIAGNOSIS — E210 Primary hyperparathyroidism: Secondary | ICD-10-CM | POA: Diagnosis not present

## 2020-01-17 DIAGNOSIS — N189 Chronic kidney disease, unspecified: Secondary | ICD-10-CM | POA: Diagnosis not present

## 2020-01-24 DIAGNOSIS — N189 Chronic kidney disease, unspecified: Secondary | ICD-10-CM | POA: Diagnosis not present

## 2020-01-24 DIAGNOSIS — R809 Proteinuria, unspecified: Secondary | ICD-10-CM | POA: Diagnosis not present

## 2020-01-24 DIAGNOSIS — E210 Primary hyperparathyroidism: Secondary | ICD-10-CM | POA: Diagnosis not present

## 2020-01-24 DIAGNOSIS — I129 Hypertensive chronic kidney disease with stage 1 through stage 4 chronic kidney disease, or unspecified chronic kidney disease: Secondary | ICD-10-CM | POA: Diagnosis not present

## 2020-01-28 DIAGNOSIS — E119 Type 2 diabetes mellitus without complications: Secondary | ICD-10-CM | POA: Diagnosis not present

## 2020-01-28 DIAGNOSIS — I10 Essential (primary) hypertension: Secondary | ICD-10-CM | POA: Diagnosis not present

## 2020-02-23 DIAGNOSIS — E119 Type 2 diabetes mellitus without complications: Secondary | ICD-10-CM | POA: Diagnosis not present

## 2020-02-28 DIAGNOSIS — I10 Essential (primary) hypertension: Secondary | ICD-10-CM | POA: Diagnosis not present

## 2020-02-28 DIAGNOSIS — E119 Type 2 diabetes mellitus without complications: Secondary | ICD-10-CM | POA: Diagnosis not present

## 2020-03-19 DIAGNOSIS — E1140 Type 2 diabetes mellitus with diabetic neuropathy, unspecified: Secondary | ICD-10-CM | POA: Diagnosis not present

## 2020-03-19 DIAGNOSIS — M79671 Pain in right foot: Secondary | ICD-10-CM | POA: Diagnosis not present

## 2020-03-19 DIAGNOSIS — I739 Peripheral vascular disease, unspecified: Secondary | ICD-10-CM | POA: Diagnosis not present

## 2020-03-19 DIAGNOSIS — M79672 Pain in left foot: Secondary | ICD-10-CM | POA: Diagnosis not present

## 2020-03-19 DIAGNOSIS — L110 Acquired keratosis follicularis: Secondary | ICD-10-CM | POA: Diagnosis not present

## 2020-04-01 DIAGNOSIS — I10 Essential (primary) hypertension: Secondary | ICD-10-CM | POA: Diagnosis not present

## 2020-04-01 DIAGNOSIS — E1142 Type 2 diabetes mellitus with diabetic polyneuropathy: Secondary | ICD-10-CM | POA: Diagnosis not present

## 2020-04-01 DIAGNOSIS — E1165 Type 2 diabetes mellitus with hyperglycemia: Secondary | ICD-10-CM | POA: Diagnosis not present

## 2020-04-01 DIAGNOSIS — E1122 Type 2 diabetes mellitus with diabetic chronic kidney disease: Secondary | ICD-10-CM | POA: Diagnosis not present

## 2020-04-01 DIAGNOSIS — Z299 Encounter for prophylactic measures, unspecified: Secondary | ICD-10-CM | POA: Diagnosis not present

## 2020-04-03 IMAGING — NM NUCLEAR MEDICINE PARATHYROID WITH SPEC
5 series · 20 of 20 positions shown · non-contrast
Comparison: None.

CLINICAL DATA: Hyperparathyroidism

EXAM:
NM PARATHYROID SCINTIGRAPHY AND SPECT IMAGING
TECHNIQUE: Following intravenous administration of radiopharmaceutical, early
and 2-hour delayed planar images were obtained in the anterior
projection. Delayed triplanar SPECT images were also obtained at 2
hours.
RADIOPHARMACEUTICALS:  24.7 mCi Ic-11m Sestamibi IV

[Series 1: spect - (id)_(id)_cor · 2.4mm · 2.37mm/px · 6 of 128 frames shown]
[frame 11/128]
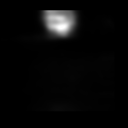
[frame 32/128]
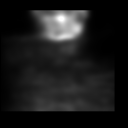
[frame 54/128]
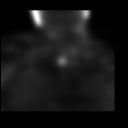
[frame 75/128]
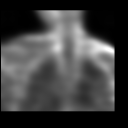
[frame 96/128]
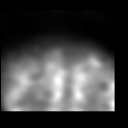
[frame 118/128]
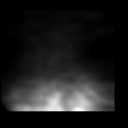

[Series 1: spect - (id)_(id)_tra · 2.4mm · 2.37mm/px · 6 of 128 frames shown]
[frame 11/128]
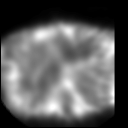
[frame 32/128]
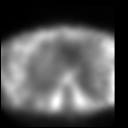
[frame 54/128]
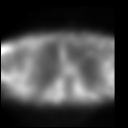
[frame 75/128]
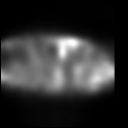
[frame 96/128]
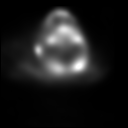
[frame 118/128]
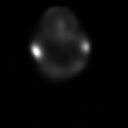

[Series 1: 15 min ant · 2.37mm/px · 1 of 1 slices shown]
[im 1/1]
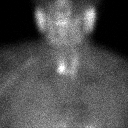

[Series 2: 2 hr ant · 2.37mm/px · 1 of 1 slices shown]
[im 1/1]
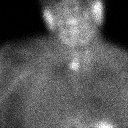

[Series 3: spect parathyroid · 2.37mm/px · 6 of 64 frames shown]
[frame 6/64  full-range]
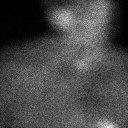
[frame 16/64  full-range]
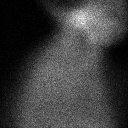
[frame 27/64  full-range]
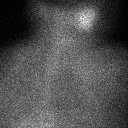
[frame 38/64  full-range]
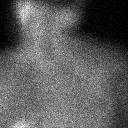
[frame 48/64  full-range]
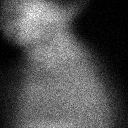
[frame 59/64  full-range]
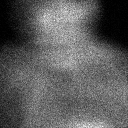

[20 of 20 positions shown; findings below may reference images not displayed]

FINDINGS: Persistent area of abnormal uptake in the lower right thyroid gland
region consistent with a parathyroid adenoma.
IMPRESSION: Findings consistent with a parathyroid adenoma in the region of the
right lower thyroid lobe.

## 2020-04-17 DIAGNOSIS — R809 Proteinuria, unspecified: Secondary | ICD-10-CM | POA: Diagnosis not present

## 2020-04-17 DIAGNOSIS — N189 Chronic kidney disease, unspecified: Secondary | ICD-10-CM | POA: Diagnosis not present

## 2020-04-17 DIAGNOSIS — E210 Primary hyperparathyroidism: Secondary | ICD-10-CM | POA: Diagnosis not present

## 2020-04-17 DIAGNOSIS — I129 Hypertensive chronic kidney disease with stage 1 through stage 4 chronic kidney disease, or unspecified chronic kidney disease: Secondary | ICD-10-CM | POA: Diagnosis not present

## 2020-04-17 DIAGNOSIS — E1122 Type 2 diabetes mellitus with diabetic chronic kidney disease: Secondary | ICD-10-CM | POA: Diagnosis not present

## 2020-04-25 DIAGNOSIS — E210 Primary hyperparathyroidism: Secondary | ICD-10-CM | POA: Diagnosis not present

## 2020-04-25 DIAGNOSIS — D638 Anemia in other chronic diseases classified elsewhere: Secondary | ICD-10-CM | POA: Diagnosis not present

## 2020-04-25 DIAGNOSIS — I129 Hypertensive chronic kidney disease with stage 1 through stage 4 chronic kidney disease, or unspecified chronic kidney disease: Secondary | ICD-10-CM | POA: Diagnosis not present

## 2020-04-25 DIAGNOSIS — N189 Chronic kidney disease, unspecified: Secondary | ICD-10-CM | POA: Diagnosis not present

## 2020-04-30 DIAGNOSIS — E1142 Type 2 diabetes mellitus with diabetic polyneuropathy: Secondary | ICD-10-CM | POA: Diagnosis not present

## 2020-04-30 DIAGNOSIS — Z299 Encounter for prophylactic measures, unspecified: Secondary | ICD-10-CM | POA: Diagnosis not present

## 2020-04-30 DIAGNOSIS — I10 Essential (primary) hypertension: Secondary | ICD-10-CM | POA: Diagnosis not present

## 2020-04-30 DIAGNOSIS — J449 Chronic obstructive pulmonary disease, unspecified: Secondary | ICD-10-CM | POA: Diagnosis not present

## 2020-04-30 DIAGNOSIS — E7800 Pure hypercholesterolemia, unspecified: Secondary | ICD-10-CM | POA: Diagnosis not present

## 2020-05-30 DIAGNOSIS — E119 Type 2 diabetes mellitus without complications: Secondary | ICD-10-CM | POA: Diagnosis not present

## 2020-05-30 DIAGNOSIS — I10 Essential (primary) hypertension: Secondary | ICD-10-CM | POA: Diagnosis not present

## 2020-06-07 DIAGNOSIS — Z79899 Other long term (current) drug therapy: Secondary | ICD-10-CM | POA: Diagnosis not present

## 2020-06-07 DIAGNOSIS — Z0000 Encounter for general adult medical examination without abnormal findings: Secondary | ICD-10-CM | POA: Diagnosis not present

## 2020-06-07 DIAGNOSIS — N2581 Secondary hyperparathyroidism of renal origin: Secondary | ICD-10-CM | POA: Diagnosis not present

## 2020-06-07 DIAGNOSIS — E559 Vitamin D deficiency, unspecified: Secondary | ICD-10-CM | POA: Diagnosis not present

## 2020-06-07 DIAGNOSIS — Z299 Encounter for prophylactic measures, unspecified: Secondary | ICD-10-CM | POA: Diagnosis not present

## 2020-06-07 DIAGNOSIS — Z7189 Other specified counseling: Secondary | ICD-10-CM | POA: Diagnosis not present

## 2020-06-07 DIAGNOSIS — R5383 Other fatigue: Secondary | ICD-10-CM | POA: Diagnosis not present

## 2020-06-07 DIAGNOSIS — Z6823 Body mass index (BMI) 23.0-23.9, adult: Secondary | ICD-10-CM | POA: Diagnosis not present

## 2020-06-07 DIAGNOSIS — E7800 Pure hypercholesterolemia, unspecified: Secondary | ICD-10-CM | POA: Diagnosis not present

## 2020-06-18 DIAGNOSIS — I739 Peripheral vascular disease, unspecified: Secondary | ICD-10-CM | POA: Diagnosis not present

## 2020-06-18 DIAGNOSIS — M79671 Pain in right foot: Secondary | ICD-10-CM | POA: Diagnosis not present

## 2020-06-18 DIAGNOSIS — L110 Acquired keratosis follicularis: Secondary | ICD-10-CM | POA: Diagnosis not present

## 2020-06-18 DIAGNOSIS — M79672 Pain in left foot: Secondary | ICD-10-CM | POA: Diagnosis not present

## 2020-06-18 DIAGNOSIS — E1140 Type 2 diabetes mellitus with diabetic neuropathy, unspecified: Secondary | ICD-10-CM | POA: Diagnosis not present

## 2020-06-28 DIAGNOSIS — I10 Essential (primary) hypertension: Secondary | ICD-10-CM | POA: Diagnosis not present

## 2020-06-28 DIAGNOSIS — E119 Type 2 diabetes mellitus without complications: Secondary | ICD-10-CM | POA: Diagnosis not present

## 2020-07-08 DIAGNOSIS — E1142 Type 2 diabetes mellitus with diabetic polyneuropathy: Secondary | ICD-10-CM | POA: Diagnosis not present

## 2020-07-08 DIAGNOSIS — E559 Vitamin D deficiency, unspecified: Secondary | ICD-10-CM | POA: Diagnosis not present

## 2020-07-08 DIAGNOSIS — E1165 Type 2 diabetes mellitus with hyperglycemia: Secondary | ICD-10-CM | POA: Diagnosis not present

## 2020-07-08 DIAGNOSIS — Z299 Encounter for prophylactic measures, unspecified: Secondary | ICD-10-CM | POA: Diagnosis not present

## 2020-07-08 DIAGNOSIS — E1122 Type 2 diabetes mellitus with diabetic chronic kidney disease: Secondary | ICD-10-CM | POA: Diagnosis not present

## 2020-07-15 DIAGNOSIS — Z794 Long term (current) use of insulin: Secondary | ICD-10-CM | POA: Diagnosis not present

## 2020-07-15 DIAGNOSIS — H524 Presbyopia: Secondary | ICD-10-CM | POA: Diagnosis not present

## 2020-07-15 DIAGNOSIS — Z961 Presence of intraocular lens: Secondary | ICD-10-CM | POA: Diagnosis not present

## 2020-07-15 DIAGNOSIS — E109 Type 1 diabetes mellitus without complications: Secondary | ICD-10-CM | POA: Diagnosis not present

## 2020-07-29 DIAGNOSIS — I129 Hypertensive chronic kidney disease with stage 1 through stage 4 chronic kidney disease, or unspecified chronic kidney disease: Secondary | ICD-10-CM | POA: Diagnosis not present

## 2020-07-29 DIAGNOSIS — E210 Primary hyperparathyroidism: Secondary | ICD-10-CM | POA: Diagnosis not present

## 2020-07-29 DIAGNOSIS — N189 Chronic kidney disease, unspecified: Secondary | ICD-10-CM | POA: Diagnosis not present

## 2020-07-29 DIAGNOSIS — E1122 Type 2 diabetes mellitus with diabetic chronic kidney disease: Secondary | ICD-10-CM | POA: Diagnosis not present

## 2020-07-30 DIAGNOSIS — I10 Essential (primary) hypertension: Secondary | ICD-10-CM | POA: Diagnosis not present

## 2020-07-30 DIAGNOSIS — E119 Type 2 diabetes mellitus without complications: Secondary | ICD-10-CM | POA: Diagnosis not present

## 2020-08-02 DIAGNOSIS — I129 Hypertensive chronic kidney disease with stage 1 through stage 4 chronic kidney disease, or unspecified chronic kidney disease: Secondary | ICD-10-CM | POA: Diagnosis not present

## 2020-08-02 DIAGNOSIS — R809 Proteinuria, unspecified: Secondary | ICD-10-CM | POA: Diagnosis not present

## 2020-08-02 DIAGNOSIS — E210 Primary hyperparathyroidism: Secondary | ICD-10-CM | POA: Diagnosis not present

## 2020-08-02 DIAGNOSIS — N189 Chronic kidney disease, unspecified: Secondary | ICD-10-CM | POA: Diagnosis not present

## 2020-08-02 DIAGNOSIS — D638 Anemia in other chronic diseases classified elsewhere: Secondary | ICD-10-CM | POA: Diagnosis not present

## 2020-08-28 DIAGNOSIS — K589 Irritable bowel syndrome without diarrhea: Secondary | ICD-10-CM | POA: Diagnosis not present

## 2020-08-28 DIAGNOSIS — N2581 Secondary hyperparathyroidism of renal origin: Secondary | ICD-10-CM | POA: Diagnosis not present

## 2020-08-28 DIAGNOSIS — E1165 Type 2 diabetes mellitus with hyperglycemia: Secondary | ICD-10-CM | POA: Diagnosis not present

## 2020-08-28 DIAGNOSIS — Z299 Encounter for prophylactic measures, unspecified: Secondary | ICD-10-CM | POA: Diagnosis not present

## 2020-08-28 DIAGNOSIS — I10 Essential (primary) hypertension: Secondary | ICD-10-CM | POA: Diagnosis not present

## 2020-08-29 DIAGNOSIS — E119 Type 2 diabetes mellitus without complications: Secondary | ICD-10-CM | POA: Diagnosis not present

## 2020-08-29 DIAGNOSIS — I10 Essential (primary) hypertension: Secondary | ICD-10-CM | POA: Diagnosis not present

## 2020-09-30 DIAGNOSIS — I10 Essential (primary) hypertension: Secondary | ICD-10-CM | POA: Diagnosis not present

## 2020-09-30 DIAGNOSIS — E875 Hyperkalemia: Secondary | ICD-10-CM | POA: Diagnosis not present

## 2020-10-01 DIAGNOSIS — M79671 Pain in right foot: Secondary | ICD-10-CM | POA: Diagnosis not present

## 2020-10-01 DIAGNOSIS — E1140 Type 2 diabetes mellitus with diabetic neuropathy, unspecified: Secondary | ICD-10-CM | POA: Diagnosis not present

## 2020-10-01 DIAGNOSIS — M79672 Pain in left foot: Secondary | ICD-10-CM | POA: Diagnosis not present

## 2020-10-01 DIAGNOSIS — I739 Peripheral vascular disease, unspecified: Secondary | ICD-10-CM | POA: Diagnosis not present

## 2020-10-01 DIAGNOSIS — L110 Acquired keratosis follicularis: Secondary | ICD-10-CM | POA: Diagnosis not present

## 2020-10-15 DIAGNOSIS — E1165 Type 2 diabetes mellitus with hyperglycemia: Secondary | ICD-10-CM | POA: Diagnosis not present

## 2020-10-15 DIAGNOSIS — I10 Essential (primary) hypertension: Secondary | ICD-10-CM | POA: Diagnosis not present

## 2020-10-15 DIAGNOSIS — M25551 Pain in right hip: Secondary | ICD-10-CM | POA: Diagnosis not present

## 2020-10-15 DIAGNOSIS — E1122 Type 2 diabetes mellitus with diabetic chronic kidney disease: Secondary | ICD-10-CM | POA: Diagnosis not present

## 2020-10-15 DIAGNOSIS — Z299 Encounter for prophylactic measures, unspecified: Secondary | ICD-10-CM | POA: Diagnosis not present

## 2020-10-15 DIAGNOSIS — Z87891 Personal history of nicotine dependence: Secondary | ICD-10-CM | POA: Diagnosis not present

## 2020-10-15 DIAGNOSIS — Z6823 Body mass index (BMI) 23.0-23.9, adult: Secondary | ICD-10-CM | POA: Diagnosis not present

## 2020-10-28 DIAGNOSIS — E875 Hyperkalemia: Secondary | ICD-10-CM | POA: Diagnosis not present

## 2020-10-28 DIAGNOSIS — I129 Hypertensive chronic kidney disease with stage 1 through stage 4 chronic kidney disease, or unspecified chronic kidney disease: Secondary | ICD-10-CM | POA: Diagnosis not present

## 2020-10-28 DIAGNOSIS — I10 Essential (primary) hypertension: Secondary | ICD-10-CM | POA: Diagnosis not present

## 2020-10-28 DIAGNOSIS — R809 Proteinuria, unspecified: Secondary | ICD-10-CM | POA: Diagnosis not present

## 2020-10-28 DIAGNOSIS — E210 Primary hyperparathyroidism: Secondary | ICD-10-CM | POA: Diagnosis not present

## 2020-10-28 DIAGNOSIS — N189 Chronic kidney disease, unspecified: Secondary | ICD-10-CM | POA: Diagnosis not present

## 2020-10-28 DIAGNOSIS — E1122 Type 2 diabetes mellitus with diabetic chronic kidney disease: Secondary | ICD-10-CM | POA: Diagnosis not present

## 2020-10-31 DIAGNOSIS — I129 Hypertensive chronic kidney disease with stage 1 through stage 4 chronic kidney disease, or unspecified chronic kidney disease: Secondary | ICD-10-CM | POA: Diagnosis not present

## 2020-10-31 DIAGNOSIS — E875 Hyperkalemia: Secondary | ICD-10-CM | POA: Diagnosis not present

## 2020-10-31 DIAGNOSIS — E1122 Type 2 diabetes mellitus with diabetic chronic kidney disease: Secondary | ICD-10-CM | POA: Diagnosis not present

## 2020-10-31 DIAGNOSIS — E210 Primary hyperparathyroidism: Secondary | ICD-10-CM | POA: Diagnosis not present

## 2020-10-31 DIAGNOSIS — N189 Chronic kidney disease, unspecified: Secondary | ICD-10-CM | POA: Diagnosis not present

## 2020-10-31 DIAGNOSIS — D638 Anemia in other chronic diseases classified elsewhere: Secondary | ICD-10-CM | POA: Diagnosis not present

## 2020-10-31 DIAGNOSIS — R809 Proteinuria, unspecified: Secondary | ICD-10-CM | POA: Diagnosis not present

## 2020-11-07 DIAGNOSIS — E875 Hyperkalemia: Secondary | ICD-10-CM | POA: Diagnosis not present

## 2020-11-07 DIAGNOSIS — E210 Primary hyperparathyroidism: Secondary | ICD-10-CM | POA: Diagnosis not present

## 2020-11-07 DIAGNOSIS — I129 Hypertensive chronic kidney disease with stage 1 through stage 4 chronic kidney disease, or unspecified chronic kidney disease: Secondary | ICD-10-CM | POA: Diagnosis not present

## 2020-11-07 DIAGNOSIS — N189 Chronic kidney disease, unspecified: Secondary | ICD-10-CM | POA: Diagnosis not present

## 2020-11-07 DIAGNOSIS — E1122 Type 2 diabetes mellitus with diabetic chronic kidney disease: Secondary | ICD-10-CM | POA: Diagnosis not present

## 2020-11-22 DIAGNOSIS — E1165 Type 2 diabetes mellitus with hyperglycemia: Secondary | ICD-10-CM | POA: Diagnosis not present

## 2020-11-22 DIAGNOSIS — Z794 Long term (current) use of insulin: Secondary | ICD-10-CM | POA: Diagnosis not present

## 2020-11-29 DIAGNOSIS — E1165 Type 2 diabetes mellitus with hyperglycemia: Secondary | ICD-10-CM | POA: Diagnosis not present

## 2020-11-29 DIAGNOSIS — Z794 Long term (current) use of insulin: Secondary | ICD-10-CM | POA: Diagnosis not present

## 2020-11-29 DIAGNOSIS — J449 Chronic obstructive pulmonary disease, unspecified: Secondary | ICD-10-CM | POA: Diagnosis not present

## 2020-11-29 DIAGNOSIS — N2581 Secondary hyperparathyroidism of renal origin: Secondary | ICD-10-CM | POA: Diagnosis not present

## 2020-11-29 DIAGNOSIS — Z299 Encounter for prophylactic measures, unspecified: Secondary | ICD-10-CM | POA: Diagnosis not present

## 2020-11-29 DIAGNOSIS — I10 Essential (primary) hypertension: Secondary | ICD-10-CM | POA: Diagnosis not present

## 2020-12-03 DIAGNOSIS — L988 Other specified disorders of the skin and subcutaneous tissue: Secondary | ICD-10-CM | POA: Diagnosis not present

## 2020-12-03 DIAGNOSIS — M79671 Pain in right foot: Secondary | ICD-10-CM | POA: Diagnosis not present

## 2020-12-03 DIAGNOSIS — E1151 Type 2 diabetes mellitus with diabetic peripheral angiopathy without gangrene: Secondary | ICD-10-CM | POA: Diagnosis not present

## 2020-12-24 DIAGNOSIS — I739 Peripheral vascular disease, unspecified: Secondary | ICD-10-CM | POA: Diagnosis not present

## 2020-12-24 DIAGNOSIS — L110 Acquired keratosis follicularis: Secondary | ICD-10-CM | POA: Diagnosis not present

## 2020-12-24 DIAGNOSIS — E1140 Type 2 diabetes mellitus with diabetic neuropathy, unspecified: Secondary | ICD-10-CM | POA: Diagnosis not present

## 2020-12-24 DIAGNOSIS — M79672 Pain in left foot: Secondary | ICD-10-CM | POA: Diagnosis not present

## 2020-12-24 DIAGNOSIS — M79671 Pain in right foot: Secondary | ICD-10-CM | POA: Diagnosis not present

## 2020-12-28 DIAGNOSIS — E875 Hyperkalemia: Secondary | ICD-10-CM | POA: Diagnosis not present

## 2020-12-28 DIAGNOSIS — I10 Essential (primary) hypertension: Secondary | ICD-10-CM | POA: Diagnosis not present

## 2021-01-03 DIAGNOSIS — N189 Chronic kidney disease, unspecified: Secondary | ICD-10-CM | POA: Diagnosis not present

## 2021-01-03 DIAGNOSIS — E875 Hyperkalemia: Secondary | ICD-10-CM | POA: Diagnosis not present

## 2021-01-03 DIAGNOSIS — E1122 Type 2 diabetes mellitus with diabetic chronic kidney disease: Secondary | ICD-10-CM | POA: Diagnosis not present

## 2021-01-03 DIAGNOSIS — E210 Primary hyperparathyroidism: Secondary | ICD-10-CM | POA: Diagnosis not present

## 2021-01-03 DIAGNOSIS — I129 Hypertensive chronic kidney disease with stage 1 through stage 4 chronic kidney disease, or unspecified chronic kidney disease: Secondary | ICD-10-CM | POA: Diagnosis not present

## 2021-01-17 DIAGNOSIS — E1129 Type 2 diabetes mellitus with other diabetic kidney complication: Secondary | ICD-10-CM | POA: Diagnosis not present

## 2021-01-17 DIAGNOSIS — Z794 Long term (current) use of insulin: Secondary | ICD-10-CM | POA: Diagnosis not present

## 2021-01-17 DIAGNOSIS — E1165 Type 2 diabetes mellitus with hyperglycemia: Secondary | ICD-10-CM | POA: Diagnosis not present

## 2021-01-17 DIAGNOSIS — Z299 Encounter for prophylactic measures, unspecified: Secondary | ICD-10-CM | POA: Diagnosis not present

## 2021-01-17 DIAGNOSIS — I10 Essential (primary) hypertension: Secondary | ICD-10-CM | POA: Diagnosis not present

## 2021-01-17 DIAGNOSIS — M25551 Pain in right hip: Secondary | ICD-10-CM | POA: Diagnosis not present

## 2021-01-30 DIAGNOSIS — N189 Chronic kidney disease, unspecified: Secondary | ICD-10-CM | POA: Diagnosis not present

## 2021-01-30 DIAGNOSIS — E1122 Type 2 diabetes mellitus with diabetic chronic kidney disease: Secondary | ICD-10-CM | POA: Diagnosis not present

## 2021-01-30 DIAGNOSIS — E875 Hyperkalemia: Secondary | ICD-10-CM | POA: Diagnosis not present

## 2021-01-30 DIAGNOSIS — D638 Anemia in other chronic diseases classified elsewhere: Secondary | ICD-10-CM | POA: Diagnosis not present

## 2021-01-30 DIAGNOSIS — I129 Hypertensive chronic kidney disease with stage 1 through stage 4 chronic kidney disease, or unspecified chronic kidney disease: Secondary | ICD-10-CM | POA: Diagnosis not present

## 2021-01-30 DIAGNOSIS — E210 Primary hyperparathyroidism: Secondary | ICD-10-CM | POA: Diagnosis not present

## 2021-02-05 DIAGNOSIS — E161 Other hypoglycemia: Secondary | ICD-10-CM | POA: Diagnosis not present

## 2021-02-05 DIAGNOSIS — I10 Essential (primary) hypertension: Secondary | ICD-10-CM | POA: Diagnosis not present

## 2021-02-05 DIAGNOSIS — R404 Transient alteration of awareness: Secondary | ICD-10-CM | POA: Diagnosis not present

## 2021-02-05 DIAGNOSIS — E162 Hypoglycemia, unspecified: Secondary | ICD-10-CM | POA: Diagnosis not present

## 2021-02-05 DIAGNOSIS — R4020 Unspecified coma: Secondary | ICD-10-CM | POA: Diagnosis not present

## 2021-02-07 DIAGNOSIS — N189 Chronic kidney disease, unspecified: Secondary | ICD-10-CM | POA: Diagnosis not present

## 2021-02-07 DIAGNOSIS — E1122 Type 2 diabetes mellitus with diabetic chronic kidney disease: Secondary | ICD-10-CM | POA: Diagnosis not present

## 2021-02-07 DIAGNOSIS — I129 Hypertensive chronic kidney disease with stage 1 through stage 4 chronic kidney disease, or unspecified chronic kidney disease: Secondary | ICD-10-CM | POA: Diagnosis not present

## 2021-02-07 DIAGNOSIS — E875 Hyperkalemia: Secondary | ICD-10-CM | POA: Diagnosis not present

## 2021-02-07 DIAGNOSIS — E210 Primary hyperparathyroidism: Secondary | ICD-10-CM | POA: Diagnosis not present

## 2021-02-18 DIAGNOSIS — E119 Type 2 diabetes mellitus without complications: Secondary | ICD-10-CM | POA: Diagnosis not present

## 2021-03-18 DIAGNOSIS — M79671 Pain in right foot: Secondary | ICD-10-CM | POA: Diagnosis not present

## 2021-03-18 DIAGNOSIS — I739 Peripheral vascular disease, unspecified: Secondary | ICD-10-CM | POA: Diagnosis not present

## 2021-03-18 DIAGNOSIS — L110 Acquired keratosis follicularis: Secondary | ICD-10-CM | POA: Diagnosis not present

## 2021-03-18 DIAGNOSIS — E1140 Type 2 diabetes mellitus with diabetic neuropathy, unspecified: Secondary | ICD-10-CM | POA: Diagnosis not present

## 2021-03-18 DIAGNOSIS — M79672 Pain in left foot: Secondary | ICD-10-CM | POA: Diagnosis not present

## 2021-03-28 DIAGNOSIS — I129 Hypertensive chronic kidney disease with stage 1 through stage 4 chronic kidney disease, or unspecified chronic kidney disease: Secondary | ICD-10-CM | POA: Diagnosis not present

## 2021-03-28 DIAGNOSIS — E1122 Type 2 diabetes mellitus with diabetic chronic kidney disease: Secondary | ICD-10-CM | POA: Diagnosis not present

## 2021-03-28 DIAGNOSIS — E875 Hyperkalemia: Secondary | ICD-10-CM | POA: Diagnosis not present

## 2021-03-28 DIAGNOSIS — N189 Chronic kidney disease, unspecified: Secondary | ICD-10-CM | POA: Diagnosis not present

## 2021-03-28 DIAGNOSIS — E210 Primary hyperparathyroidism: Secondary | ICD-10-CM | POA: Diagnosis not present

## 2021-03-30 DIAGNOSIS — E161 Other hypoglycemia: Secondary | ICD-10-CM | POA: Diagnosis not present

## 2021-03-30 DIAGNOSIS — R410 Disorientation, unspecified: Secondary | ICD-10-CM | POA: Diagnosis not present

## 2021-03-30 DIAGNOSIS — E162 Hypoglycemia, unspecified: Secondary | ICD-10-CM | POA: Diagnosis not present

## 2021-03-30 DIAGNOSIS — I10 Essential (primary) hypertension: Secondary | ICD-10-CM | POA: Diagnosis not present

## 2021-03-30 DIAGNOSIS — E875 Hyperkalemia: Secondary | ICD-10-CM | POA: Diagnosis not present

## 2021-03-31 DIAGNOSIS — E1122 Type 2 diabetes mellitus with diabetic chronic kidney disease: Secondary | ICD-10-CM | POA: Diagnosis not present

## 2021-03-31 DIAGNOSIS — N189 Chronic kidney disease, unspecified: Secondary | ICD-10-CM | POA: Diagnosis not present

## 2021-03-31 DIAGNOSIS — I129 Hypertensive chronic kidney disease with stage 1 through stage 4 chronic kidney disease, or unspecified chronic kidney disease: Secondary | ICD-10-CM | POA: Diagnosis not present

## 2021-03-31 DIAGNOSIS — E875 Hyperkalemia: Secondary | ICD-10-CM | POA: Diagnosis not present

## 2021-03-31 DIAGNOSIS — E210 Primary hyperparathyroidism: Secondary | ICD-10-CM | POA: Diagnosis not present

## 2021-04-04 DIAGNOSIS — E210 Primary hyperparathyroidism: Secondary | ICD-10-CM | POA: Diagnosis not present

## 2021-04-04 DIAGNOSIS — E875 Hyperkalemia: Secondary | ICD-10-CM | POA: Diagnosis not present

## 2021-04-04 DIAGNOSIS — D638 Anemia in other chronic diseases classified elsewhere: Secondary | ICD-10-CM | POA: Diagnosis not present

## 2021-04-04 DIAGNOSIS — N189 Chronic kidney disease, unspecified: Secondary | ICD-10-CM | POA: Diagnosis not present

## 2021-04-04 DIAGNOSIS — E1122 Type 2 diabetes mellitus with diabetic chronic kidney disease: Secondary | ICD-10-CM | POA: Diagnosis not present

## 2021-04-04 DIAGNOSIS — D472 Monoclonal gammopathy: Secondary | ICD-10-CM | POA: Diagnosis not present

## 2021-04-04 DIAGNOSIS — I129 Hypertensive chronic kidney disease with stage 1 through stage 4 chronic kidney disease, or unspecified chronic kidney disease: Secondary | ICD-10-CM | POA: Diagnosis not present

## 2021-04-24 DIAGNOSIS — E1165 Type 2 diabetes mellitus with hyperglycemia: Secondary | ICD-10-CM | POA: Diagnosis not present

## 2021-04-24 DIAGNOSIS — M25551 Pain in right hip: Secondary | ICD-10-CM | POA: Diagnosis not present

## 2021-04-24 DIAGNOSIS — Z299 Encounter for prophylactic measures, unspecified: Secondary | ICD-10-CM | POA: Diagnosis not present

## 2021-04-24 DIAGNOSIS — J449 Chronic obstructive pulmonary disease, unspecified: Secondary | ICD-10-CM | POA: Diagnosis not present

## 2021-04-24 DIAGNOSIS — I10 Essential (primary) hypertension: Secondary | ICD-10-CM | POA: Diagnosis not present

## 2021-04-29 DIAGNOSIS — M79604 Pain in right leg: Secondary | ICD-10-CM | POA: Diagnosis not present

## 2021-04-29 DIAGNOSIS — E161 Other hypoglycemia: Secondary | ICD-10-CM | POA: Diagnosis not present

## 2021-04-29 DIAGNOSIS — M25551 Pain in right hip: Secondary | ICD-10-CM | POA: Diagnosis not present

## 2021-04-29 DIAGNOSIS — E162 Hypoglycemia, unspecified: Secondary | ICD-10-CM | POA: Diagnosis not present

## 2021-04-29 DIAGNOSIS — R404 Transient alteration of awareness: Secondary | ICD-10-CM | POA: Diagnosis not present

## 2021-04-29 DIAGNOSIS — R2681 Unsteadiness on feet: Secondary | ICD-10-CM | POA: Diagnosis not present

## 2021-04-29 DIAGNOSIS — M533 Sacrococcygeal disorders, not elsewhere classified: Secondary | ICD-10-CM | POA: Diagnosis not present

## 2021-04-29 DIAGNOSIS — G8929 Other chronic pain: Secondary | ICD-10-CM | POA: Diagnosis not present

## 2021-04-29 DIAGNOSIS — M5417 Radiculopathy, lumbosacral region: Secondary | ICD-10-CM | POA: Diagnosis not present

## 2021-05-06 DIAGNOSIS — M79604 Pain in right leg: Secondary | ICD-10-CM | POA: Diagnosis not present

## 2021-05-06 DIAGNOSIS — M5417 Radiculopathy, lumbosacral region: Secondary | ICD-10-CM | POA: Diagnosis not present

## 2021-05-06 DIAGNOSIS — R2681 Unsteadiness on feet: Secondary | ICD-10-CM | POA: Diagnosis not present

## 2021-05-06 DIAGNOSIS — M533 Sacrococcygeal disorders, not elsewhere classified: Secondary | ICD-10-CM | POA: Diagnosis not present

## 2021-05-06 DIAGNOSIS — M25551 Pain in right hip: Secondary | ICD-10-CM | POA: Diagnosis not present

## 2021-05-23 DIAGNOSIS — M79604 Pain in right leg: Secondary | ICD-10-CM | POA: Diagnosis not present

## 2021-05-23 DIAGNOSIS — M5417 Radiculopathy, lumbosacral region: Secondary | ICD-10-CM | POA: Diagnosis not present

## 2021-05-23 DIAGNOSIS — M25551 Pain in right hip: Secondary | ICD-10-CM | POA: Diagnosis not present

## 2021-05-23 DIAGNOSIS — M533 Sacrococcygeal disorders, not elsewhere classified: Secondary | ICD-10-CM | POA: Diagnosis not present

## 2021-05-23 DIAGNOSIS — R2681 Unsteadiness on feet: Secondary | ICD-10-CM | POA: Diagnosis not present

## 2021-05-26 DIAGNOSIS — W1839XA Other fall on same level, initial encounter: Secondary | ICD-10-CM | POA: Diagnosis not present

## 2021-05-26 DIAGNOSIS — S52501A Unspecified fracture of the lower end of right radius, initial encounter for closed fracture: Secondary | ICD-10-CM | POA: Diagnosis not present

## 2021-05-26 DIAGNOSIS — Z880 Allergy status to penicillin: Secondary | ICD-10-CM | POA: Diagnosis not present

## 2021-05-26 DIAGNOSIS — Z7982 Long term (current) use of aspirin: Secondary | ICD-10-CM | POA: Diagnosis not present

## 2021-05-26 DIAGNOSIS — M7989 Other specified soft tissue disorders: Secondary | ICD-10-CM | POA: Diagnosis not present

## 2021-05-30 DIAGNOSIS — I10 Essential (primary) hypertension: Secondary | ICD-10-CM | POA: Diagnosis not present

## 2021-05-30 DIAGNOSIS — E875 Hyperkalemia: Secondary | ICD-10-CM | POA: Diagnosis not present

## 2021-06-02 DIAGNOSIS — S52571A Other intraarticular fracture of lower end of right radius, initial encounter for closed fracture: Secondary | ICD-10-CM | POA: Diagnosis not present

## 2021-06-02 DIAGNOSIS — S52551A Other extraarticular fracture of lower end of right radius, initial encounter for closed fracture: Secondary | ICD-10-CM | POA: Diagnosis not present

## 2021-06-06 DIAGNOSIS — Z299 Encounter for prophylactic measures, unspecified: Secondary | ICD-10-CM | POA: Diagnosis not present

## 2021-06-06 DIAGNOSIS — E1129 Type 2 diabetes mellitus with other diabetic kidney complication: Secondary | ICD-10-CM | POA: Diagnosis not present

## 2021-06-06 DIAGNOSIS — R5383 Other fatigue: Secondary | ICD-10-CM | POA: Diagnosis not present

## 2021-06-06 DIAGNOSIS — J449 Chronic obstructive pulmonary disease, unspecified: Secondary | ICD-10-CM | POA: Diagnosis not present

## 2021-06-06 DIAGNOSIS — Z23 Encounter for immunization: Secondary | ICD-10-CM | POA: Diagnosis not present

## 2021-06-06 DIAGNOSIS — I10 Essential (primary) hypertension: Secondary | ICD-10-CM | POA: Diagnosis not present

## 2021-06-09 DIAGNOSIS — M25531 Pain in right wrist: Secondary | ICD-10-CM | POA: Diagnosis not present

## 2021-06-16 DIAGNOSIS — M533 Sacrococcygeal disorders, not elsewhere classified: Secondary | ICD-10-CM | POA: Diagnosis not present

## 2021-06-16 DIAGNOSIS — M5417 Radiculopathy, lumbosacral region: Secondary | ICD-10-CM | POA: Diagnosis not present

## 2021-06-16 DIAGNOSIS — M79604 Pain in right leg: Secondary | ICD-10-CM | POA: Diagnosis not present

## 2021-06-16 DIAGNOSIS — M25551 Pain in right hip: Secondary | ICD-10-CM | POA: Diagnosis not present

## 2021-06-16 DIAGNOSIS — R2681 Unsteadiness on feet: Secondary | ICD-10-CM | POA: Diagnosis not present

## 2021-06-17 DIAGNOSIS — M79672 Pain in left foot: Secondary | ICD-10-CM | POA: Diagnosis not present

## 2021-06-17 DIAGNOSIS — E1140 Type 2 diabetes mellitus with diabetic neuropathy, unspecified: Secondary | ICD-10-CM | POA: Diagnosis not present

## 2021-06-17 DIAGNOSIS — L110 Acquired keratosis follicularis: Secondary | ICD-10-CM | POA: Diagnosis not present

## 2021-06-17 DIAGNOSIS — I739 Peripheral vascular disease, unspecified: Secondary | ICD-10-CM | POA: Diagnosis not present

## 2021-06-17 DIAGNOSIS — M79671 Pain in right foot: Secondary | ICD-10-CM | POA: Diagnosis not present

## 2021-06-20 DIAGNOSIS — M79604 Pain in right leg: Secondary | ICD-10-CM | POA: Diagnosis not present

## 2021-06-20 DIAGNOSIS — R2681 Unsteadiness on feet: Secondary | ICD-10-CM | POA: Diagnosis not present

## 2021-06-20 DIAGNOSIS — M533 Sacrococcygeal disorders, not elsewhere classified: Secondary | ICD-10-CM | POA: Diagnosis not present

## 2021-06-20 DIAGNOSIS — M5417 Radiculopathy, lumbosacral region: Secondary | ICD-10-CM | POA: Diagnosis not present

## 2021-06-20 DIAGNOSIS — M25551 Pain in right hip: Secondary | ICD-10-CM | POA: Diagnosis not present

## 2021-06-23 DIAGNOSIS — M7989 Other specified soft tissue disorders: Secondary | ICD-10-CM | POA: Diagnosis not present

## 2021-06-23 DIAGNOSIS — S52551D Other extraarticular fracture of lower end of right radius, subsequent encounter for closed fracture with routine healing: Secondary | ICD-10-CM | POA: Diagnosis not present

## 2021-06-23 DIAGNOSIS — S52501A Unspecified fracture of the lower end of right radius, initial encounter for closed fracture: Secondary | ICD-10-CM | POA: Diagnosis not present

## 2021-06-25 DIAGNOSIS — M533 Sacrococcygeal disorders, not elsewhere classified: Secondary | ICD-10-CM | POA: Diagnosis not present

## 2021-06-25 DIAGNOSIS — Z7189 Other specified counseling: Secondary | ICD-10-CM | POA: Diagnosis not present

## 2021-06-25 DIAGNOSIS — E1165 Type 2 diabetes mellitus with hyperglycemia: Secondary | ICD-10-CM | POA: Diagnosis not present

## 2021-06-25 DIAGNOSIS — E559 Vitamin D deficiency, unspecified: Secondary | ICD-10-CM | POA: Diagnosis not present

## 2021-06-25 DIAGNOSIS — R2681 Unsteadiness on feet: Secondary | ICD-10-CM | POA: Diagnosis not present

## 2021-06-25 DIAGNOSIS — I10 Essential (primary) hypertension: Secondary | ICD-10-CM | POA: Diagnosis not present

## 2021-06-25 DIAGNOSIS — S52551D Other extraarticular fracture of lower end of right radius, subsequent encounter for closed fracture with routine healing: Secondary | ICD-10-CM | POA: Diagnosis not present

## 2021-06-25 DIAGNOSIS — Z6824 Body mass index (BMI) 24.0-24.9, adult: Secondary | ICD-10-CM | POA: Diagnosis not present

## 2021-06-25 DIAGNOSIS — M79604 Pain in right leg: Secondary | ICD-10-CM | POA: Diagnosis not present

## 2021-06-25 DIAGNOSIS — M25551 Pain in right hip: Secondary | ICD-10-CM | POA: Diagnosis not present

## 2021-06-25 DIAGNOSIS — Z299 Encounter for prophylactic measures, unspecified: Secondary | ICD-10-CM | POA: Diagnosis not present

## 2021-06-25 DIAGNOSIS — Z0000 Encounter for general adult medical examination without abnormal findings: Secondary | ICD-10-CM | POA: Diagnosis not present

## 2021-06-25 DIAGNOSIS — E7800 Pure hypercholesterolemia, unspecified: Secondary | ICD-10-CM | POA: Diagnosis not present

## 2021-06-25 DIAGNOSIS — M5417 Radiculopathy, lumbosacral region: Secondary | ICD-10-CM | POA: Diagnosis not present

## 2021-06-25 DIAGNOSIS — Z79899 Other long term (current) drug therapy: Secondary | ICD-10-CM | POA: Diagnosis not present

## 2021-06-25 DIAGNOSIS — R5383 Other fatigue: Secondary | ICD-10-CM | POA: Diagnosis not present

## 2021-06-30 DIAGNOSIS — M5417 Radiculopathy, lumbosacral region: Secondary | ICD-10-CM | POA: Diagnosis not present

## 2021-06-30 DIAGNOSIS — S52551D Other extraarticular fracture of lower end of right radius, subsequent encounter for closed fracture with routine healing: Secondary | ICD-10-CM | POA: Diagnosis not present

## 2021-06-30 DIAGNOSIS — R2681 Unsteadiness on feet: Secondary | ICD-10-CM | POA: Diagnosis not present

## 2021-06-30 DIAGNOSIS — M25551 Pain in right hip: Secondary | ICD-10-CM | POA: Diagnosis not present

## 2021-06-30 DIAGNOSIS — M79604 Pain in right leg: Secondary | ICD-10-CM | POA: Diagnosis not present

## 2021-06-30 DIAGNOSIS — M533 Sacrococcygeal disorders, not elsewhere classified: Secondary | ICD-10-CM | POA: Diagnosis not present

## 2021-07-02 DIAGNOSIS — N189 Chronic kidney disease, unspecified: Secondary | ICD-10-CM | POA: Diagnosis not present

## 2021-07-02 DIAGNOSIS — E210 Primary hyperparathyroidism: Secondary | ICD-10-CM | POA: Diagnosis not present

## 2021-07-02 DIAGNOSIS — I129 Hypertensive chronic kidney disease with stage 1 through stage 4 chronic kidney disease, or unspecified chronic kidney disease: Secondary | ICD-10-CM | POA: Diagnosis not present

## 2021-07-02 DIAGNOSIS — E875 Hyperkalemia: Secondary | ICD-10-CM | POA: Diagnosis not present

## 2021-07-02 DIAGNOSIS — E1122 Type 2 diabetes mellitus with diabetic chronic kidney disease: Secondary | ICD-10-CM | POA: Diagnosis not present

## 2021-07-03 DIAGNOSIS — M25551 Pain in right hip: Secondary | ICD-10-CM | POA: Diagnosis not present

## 2021-07-03 DIAGNOSIS — M79604 Pain in right leg: Secondary | ICD-10-CM | POA: Diagnosis not present

## 2021-07-03 DIAGNOSIS — R2681 Unsteadiness on feet: Secondary | ICD-10-CM | POA: Diagnosis not present

## 2021-07-03 DIAGNOSIS — S52551D Other extraarticular fracture of lower end of right radius, subsequent encounter for closed fracture with routine healing: Secondary | ICD-10-CM | POA: Diagnosis not present

## 2021-07-03 DIAGNOSIS — M5417 Radiculopathy, lumbosacral region: Secondary | ICD-10-CM | POA: Diagnosis not present

## 2021-07-03 DIAGNOSIS — M533 Sacrococcygeal disorders, not elsewhere classified: Secondary | ICD-10-CM | POA: Diagnosis not present

## 2021-07-07 DIAGNOSIS — M5417 Radiculopathy, lumbosacral region: Secondary | ICD-10-CM | POA: Diagnosis not present

## 2021-07-07 DIAGNOSIS — R2681 Unsteadiness on feet: Secondary | ICD-10-CM | POA: Diagnosis not present

## 2021-07-07 DIAGNOSIS — M79604 Pain in right leg: Secondary | ICD-10-CM | POA: Diagnosis not present

## 2021-07-07 DIAGNOSIS — S52551D Other extraarticular fracture of lower end of right radius, subsequent encounter for closed fracture with routine healing: Secondary | ICD-10-CM | POA: Diagnosis not present

## 2021-07-07 DIAGNOSIS — M25551 Pain in right hip: Secondary | ICD-10-CM | POA: Diagnosis not present

## 2021-07-07 DIAGNOSIS — M533 Sacrococcygeal disorders, not elsewhere classified: Secondary | ICD-10-CM | POA: Diagnosis not present

## 2021-07-09 DIAGNOSIS — S52551D Other extraarticular fracture of lower end of right radius, subsequent encounter for closed fracture with routine healing: Secondary | ICD-10-CM | POA: Diagnosis not present

## 2021-07-10 DIAGNOSIS — E871 Hypo-osmolality and hyponatremia: Secondary | ICD-10-CM | POA: Diagnosis not present

## 2021-07-10 DIAGNOSIS — R2681 Unsteadiness on feet: Secondary | ICD-10-CM | POA: Diagnosis not present

## 2021-07-10 DIAGNOSIS — E559 Vitamin D deficiency, unspecified: Secondary | ICD-10-CM | POA: Diagnosis not present

## 2021-07-10 DIAGNOSIS — M5417 Radiculopathy, lumbosacral region: Secondary | ICD-10-CM | POA: Diagnosis not present

## 2021-07-10 DIAGNOSIS — S52551D Other extraarticular fracture of lower end of right radius, subsequent encounter for closed fracture with routine healing: Secondary | ICD-10-CM | POA: Diagnosis not present

## 2021-07-10 DIAGNOSIS — M79604 Pain in right leg: Secondary | ICD-10-CM | POA: Diagnosis not present

## 2021-07-10 DIAGNOSIS — E210 Primary hyperparathyroidism: Secondary | ICD-10-CM | POA: Diagnosis not present

## 2021-07-10 DIAGNOSIS — M533 Sacrococcygeal disorders, not elsewhere classified: Secondary | ICD-10-CM | POA: Diagnosis not present

## 2021-07-10 DIAGNOSIS — E1122 Type 2 diabetes mellitus with diabetic chronic kidney disease: Secondary | ICD-10-CM | POA: Diagnosis not present

## 2021-07-10 DIAGNOSIS — D638 Anemia in other chronic diseases classified elsewhere: Secondary | ICD-10-CM | POA: Diagnosis not present

## 2021-07-10 DIAGNOSIS — I129 Hypertensive chronic kidney disease with stage 1 through stage 4 chronic kidney disease, or unspecified chronic kidney disease: Secondary | ICD-10-CM | POA: Diagnosis not present

## 2021-07-10 DIAGNOSIS — M25551 Pain in right hip: Secondary | ICD-10-CM | POA: Diagnosis not present

## 2021-07-10 DIAGNOSIS — N189 Chronic kidney disease, unspecified: Secondary | ICD-10-CM | POA: Diagnosis not present

## 2021-07-14 DIAGNOSIS — S52551D Other extraarticular fracture of lower end of right radius, subsequent encounter for closed fracture with routine healing: Secondary | ICD-10-CM | POA: Diagnosis not present

## 2021-07-14 DIAGNOSIS — M79604 Pain in right leg: Secondary | ICD-10-CM | POA: Diagnosis not present

## 2021-07-14 DIAGNOSIS — M25551 Pain in right hip: Secondary | ICD-10-CM | POA: Diagnosis not present

## 2021-07-14 DIAGNOSIS — M533 Sacrococcygeal disorders, not elsewhere classified: Secondary | ICD-10-CM | POA: Diagnosis not present

## 2021-07-14 DIAGNOSIS — R2681 Unsteadiness on feet: Secondary | ICD-10-CM | POA: Diagnosis not present

## 2021-07-14 DIAGNOSIS — M5417 Radiculopathy, lumbosacral region: Secondary | ICD-10-CM | POA: Diagnosis not present

## 2021-07-17 DIAGNOSIS — H524 Presbyopia: Secondary | ICD-10-CM | POA: Diagnosis not present

## 2021-07-17 DIAGNOSIS — E103293 Type 1 diabetes mellitus with mild nonproliferative diabetic retinopathy without macular edema, bilateral: Secondary | ICD-10-CM | POA: Diagnosis not present

## 2021-07-17 DIAGNOSIS — Z794 Long term (current) use of insulin: Secondary | ICD-10-CM | POA: Diagnosis not present

## 2021-07-17 DIAGNOSIS — Z961 Presence of intraocular lens: Secondary | ICD-10-CM | POA: Diagnosis not present

## 2021-07-18 DIAGNOSIS — M79604 Pain in right leg: Secondary | ICD-10-CM | POA: Diagnosis not present

## 2021-07-18 DIAGNOSIS — M25551 Pain in right hip: Secondary | ICD-10-CM | POA: Diagnosis not present

## 2021-07-18 DIAGNOSIS — M5417 Radiculopathy, lumbosacral region: Secondary | ICD-10-CM | POA: Diagnosis not present

## 2021-07-18 DIAGNOSIS — M533 Sacrococcygeal disorders, not elsewhere classified: Secondary | ICD-10-CM | POA: Diagnosis not present

## 2021-07-18 DIAGNOSIS — S52551D Other extraarticular fracture of lower end of right radius, subsequent encounter for closed fracture with routine healing: Secondary | ICD-10-CM | POA: Diagnosis not present

## 2021-07-18 DIAGNOSIS — R2681 Unsteadiness on feet: Secondary | ICD-10-CM | POA: Diagnosis not present

## 2021-07-22 DIAGNOSIS — M79604 Pain in right leg: Secondary | ICD-10-CM | POA: Diagnosis not present

## 2021-07-22 DIAGNOSIS — R2681 Unsteadiness on feet: Secondary | ICD-10-CM | POA: Diagnosis not present

## 2021-07-22 DIAGNOSIS — M5417 Radiculopathy, lumbosacral region: Secondary | ICD-10-CM | POA: Diagnosis not present

## 2021-07-22 DIAGNOSIS — M25551 Pain in right hip: Secondary | ICD-10-CM | POA: Diagnosis not present

## 2021-07-22 DIAGNOSIS — S52551D Other extraarticular fracture of lower end of right radius, subsequent encounter for closed fracture with routine healing: Secondary | ICD-10-CM | POA: Diagnosis not present

## 2021-07-22 DIAGNOSIS — M533 Sacrococcygeal disorders, not elsewhere classified: Secondary | ICD-10-CM | POA: Diagnosis not present

## 2021-07-30 DIAGNOSIS — M533 Sacrococcygeal disorders, not elsewhere classified: Secondary | ICD-10-CM | POA: Diagnosis not present

## 2021-07-30 DIAGNOSIS — M79604 Pain in right leg: Secondary | ICD-10-CM | POA: Diagnosis not present

## 2021-07-30 DIAGNOSIS — S52551D Other extraarticular fracture of lower end of right radius, subsequent encounter for closed fracture with routine healing: Secondary | ICD-10-CM | POA: Diagnosis not present

## 2021-07-30 DIAGNOSIS — R2681 Unsteadiness on feet: Secondary | ICD-10-CM | POA: Diagnosis not present

## 2021-07-30 DIAGNOSIS — M25551 Pain in right hip: Secondary | ICD-10-CM | POA: Diagnosis not present

## 2021-07-30 DIAGNOSIS — M5417 Radiculopathy, lumbosacral region: Secondary | ICD-10-CM | POA: Diagnosis not present

## 2021-08-01 DIAGNOSIS — M79604 Pain in right leg: Secondary | ICD-10-CM | POA: Diagnosis not present

## 2021-08-01 DIAGNOSIS — M25551 Pain in right hip: Secondary | ICD-10-CM | POA: Diagnosis not present

## 2021-08-01 DIAGNOSIS — M533 Sacrococcygeal disorders, not elsewhere classified: Secondary | ICD-10-CM | POA: Diagnosis not present

## 2021-08-01 DIAGNOSIS — S52551D Other extraarticular fracture of lower end of right radius, subsequent encounter for closed fracture with routine healing: Secondary | ICD-10-CM | POA: Diagnosis not present

## 2021-08-01 DIAGNOSIS — R2681 Unsteadiness on feet: Secondary | ICD-10-CM | POA: Diagnosis not present

## 2021-08-01 DIAGNOSIS — M5417 Radiculopathy, lumbosacral region: Secondary | ICD-10-CM | POA: Diagnosis not present

## 2021-08-06 DIAGNOSIS — M79604 Pain in right leg: Secondary | ICD-10-CM | POA: Diagnosis not present

## 2021-08-06 DIAGNOSIS — S52551D Other extraarticular fracture of lower end of right radius, subsequent encounter for closed fracture with routine healing: Secondary | ICD-10-CM | POA: Diagnosis not present

## 2021-08-06 DIAGNOSIS — M25551 Pain in right hip: Secondary | ICD-10-CM | POA: Diagnosis not present

## 2021-08-06 DIAGNOSIS — M533 Sacrococcygeal disorders, not elsewhere classified: Secondary | ICD-10-CM | POA: Diagnosis not present

## 2021-08-06 DIAGNOSIS — M5417 Radiculopathy, lumbosacral region: Secondary | ICD-10-CM | POA: Diagnosis not present

## 2021-08-06 DIAGNOSIS — R2681 Unsteadiness on feet: Secondary | ICD-10-CM | POA: Diagnosis not present

## 2021-08-11 DIAGNOSIS — M533 Sacrococcygeal disorders, not elsewhere classified: Secondary | ICD-10-CM | POA: Diagnosis not present

## 2021-08-11 DIAGNOSIS — M79604 Pain in right leg: Secondary | ICD-10-CM | POA: Diagnosis not present

## 2021-08-11 DIAGNOSIS — M5417 Radiculopathy, lumbosacral region: Secondary | ICD-10-CM | POA: Diagnosis not present

## 2021-08-11 DIAGNOSIS — M25551 Pain in right hip: Secondary | ICD-10-CM | POA: Diagnosis not present

## 2021-08-11 DIAGNOSIS — R2681 Unsteadiness on feet: Secondary | ICD-10-CM | POA: Diagnosis not present

## 2021-08-11 DIAGNOSIS — S52551D Other extraarticular fracture of lower end of right radius, subsequent encounter for closed fracture with routine healing: Secondary | ICD-10-CM | POA: Diagnosis not present

## 2021-09-11 DIAGNOSIS — E559 Vitamin D deficiency, unspecified: Secondary | ICD-10-CM | POA: Diagnosis not present

## 2021-09-11 DIAGNOSIS — N189 Chronic kidney disease, unspecified: Secondary | ICD-10-CM | POA: Diagnosis not present

## 2021-09-11 DIAGNOSIS — I129 Hypertensive chronic kidney disease with stage 1 through stage 4 chronic kidney disease, or unspecified chronic kidney disease: Secondary | ICD-10-CM | POA: Diagnosis not present

## 2021-09-11 DIAGNOSIS — E1122 Type 2 diabetes mellitus with diabetic chronic kidney disease: Secondary | ICD-10-CM | POA: Diagnosis not present

## 2021-09-11 DIAGNOSIS — E210 Primary hyperparathyroidism: Secondary | ICD-10-CM | POA: Diagnosis not present

## 2021-09-15 DIAGNOSIS — E210 Primary hyperparathyroidism: Secondary | ICD-10-CM | POA: Diagnosis not present

## 2021-09-15 DIAGNOSIS — D638 Anemia in other chronic diseases classified elsewhere: Secondary | ICD-10-CM | POA: Diagnosis not present

## 2021-09-15 DIAGNOSIS — E871 Hypo-osmolality and hyponatremia: Secondary | ICD-10-CM | POA: Diagnosis not present

## 2021-09-15 DIAGNOSIS — R809 Proteinuria, unspecified: Secondary | ICD-10-CM | POA: Diagnosis not present

## 2021-09-15 DIAGNOSIS — E559 Vitamin D deficiency, unspecified: Secondary | ICD-10-CM | POA: Diagnosis not present

## 2021-09-15 DIAGNOSIS — E1122 Type 2 diabetes mellitus with diabetic chronic kidney disease: Secondary | ICD-10-CM | POA: Diagnosis not present

## 2021-09-15 DIAGNOSIS — N189 Chronic kidney disease, unspecified: Secondary | ICD-10-CM | POA: Diagnosis not present

## 2021-09-15 DIAGNOSIS — I129 Hypertensive chronic kidney disease with stage 1 through stage 4 chronic kidney disease, or unspecified chronic kidney disease: Secondary | ICD-10-CM | POA: Diagnosis not present

## 2021-09-15 DIAGNOSIS — E875 Hyperkalemia: Secondary | ICD-10-CM | POA: Diagnosis not present

## 2021-09-15 DIAGNOSIS — D472 Monoclonal gammopathy: Secondary | ICD-10-CM | POA: Diagnosis not present

## 2021-09-16 DIAGNOSIS — E1140 Type 2 diabetes mellitus with diabetic neuropathy, unspecified: Secondary | ICD-10-CM | POA: Diagnosis not present

## 2021-09-16 DIAGNOSIS — L110 Acquired keratosis follicularis: Secondary | ICD-10-CM | POA: Diagnosis not present

## 2021-09-16 DIAGNOSIS — I739 Peripheral vascular disease, unspecified: Secondary | ICD-10-CM | POA: Diagnosis not present

## 2021-09-16 DIAGNOSIS — M79672 Pain in left foot: Secondary | ICD-10-CM | POA: Diagnosis not present

## 2021-09-16 DIAGNOSIS — M79671 Pain in right foot: Secondary | ICD-10-CM | POA: Diagnosis not present

## 2021-09-29 DIAGNOSIS — E1122 Type 2 diabetes mellitus with diabetic chronic kidney disease: Secondary | ICD-10-CM | POA: Diagnosis not present

## 2021-09-29 DIAGNOSIS — N189 Chronic kidney disease, unspecified: Secondary | ICD-10-CM | POA: Diagnosis not present

## 2021-09-29 DIAGNOSIS — E210 Primary hyperparathyroidism: Secondary | ICD-10-CM | POA: Diagnosis not present

## 2021-09-29 DIAGNOSIS — E559 Vitamin D deficiency, unspecified: Secondary | ICD-10-CM | POA: Diagnosis not present

## 2021-09-30 DIAGNOSIS — Z6824 Body mass index (BMI) 24.0-24.9, adult: Secondary | ICD-10-CM | POA: Diagnosis not present

## 2021-09-30 DIAGNOSIS — I10 Essential (primary) hypertension: Secondary | ICD-10-CM | POA: Diagnosis not present

## 2021-09-30 DIAGNOSIS — Z87891 Personal history of nicotine dependence: Secondary | ICD-10-CM | POA: Diagnosis not present

## 2021-09-30 DIAGNOSIS — E1165 Type 2 diabetes mellitus with hyperglycemia: Secondary | ICD-10-CM | POA: Diagnosis not present

## 2021-09-30 DIAGNOSIS — N183 Chronic kidney disease, stage 3 unspecified: Secondary | ICD-10-CM | POA: Diagnosis not present

## 2021-09-30 DIAGNOSIS — Z299 Encounter for prophylactic measures, unspecified: Secondary | ICD-10-CM | POA: Diagnosis not present

## 2021-09-30 DIAGNOSIS — E1122 Type 2 diabetes mellitus with diabetic chronic kidney disease: Secondary | ICD-10-CM | POA: Diagnosis not present

## 2021-10-07 DIAGNOSIS — R410 Disorientation, unspecified: Secondary | ICD-10-CM | POA: Diagnosis not present

## 2021-10-07 DIAGNOSIS — R404 Transient alteration of awareness: Secondary | ICD-10-CM | POA: Diagnosis not present

## 2021-10-07 DIAGNOSIS — E161 Other hypoglycemia: Secondary | ICD-10-CM | POA: Diagnosis not present

## 2021-10-07 DIAGNOSIS — E162 Hypoglycemia, unspecified: Secondary | ICD-10-CM | POA: Diagnosis not present

## 2021-10-28 DIAGNOSIS — E875 Hyperkalemia: Secondary | ICD-10-CM | POA: Diagnosis not present

## 2021-10-28 DIAGNOSIS — I10 Essential (primary) hypertension: Secondary | ICD-10-CM | POA: Diagnosis not present

## 2021-10-29 DIAGNOSIS — H31091 Other chorioretinal scars, right eye: Secondary | ICD-10-CM | POA: Diagnosis not present

## 2021-10-29 DIAGNOSIS — H43822 Vitreomacular adhesion, left eye: Secondary | ICD-10-CM | POA: Diagnosis not present

## 2021-10-29 DIAGNOSIS — E103293 Type 1 diabetes mellitus with mild nonproliferative diabetic retinopathy without macular edema, bilateral: Secondary | ICD-10-CM | POA: Diagnosis not present

## 2021-10-29 DIAGNOSIS — H3509 Other intraretinal microvascular abnormalities: Secondary | ICD-10-CM | POA: Diagnosis not present

## 2021-11-12 ENCOUNTER — Telehealth: Payer: Self-pay | Admitting: "Endocrinology

## 2021-11-12 NOTE — Telephone Encounter (Signed)
Received referral on patient 09/15/21. Called the # listed on referral and they stated we had the wrong #. Closed Referral ?

## 2021-11-18 DIAGNOSIS — E210 Primary hyperparathyroidism: Secondary | ICD-10-CM | POA: Diagnosis not present

## 2021-11-18 DIAGNOSIS — E871 Hypo-osmolality and hyponatremia: Secondary | ICD-10-CM | POA: Diagnosis not present

## 2021-11-18 DIAGNOSIS — N189 Chronic kidney disease, unspecified: Secondary | ICD-10-CM | POA: Diagnosis not present

## 2021-11-18 DIAGNOSIS — E1122 Type 2 diabetes mellitus with diabetic chronic kidney disease: Secondary | ICD-10-CM | POA: Diagnosis not present

## 2021-11-25 DIAGNOSIS — E210 Primary hyperparathyroidism: Secondary | ICD-10-CM | POA: Diagnosis not present

## 2021-11-25 DIAGNOSIS — R809 Proteinuria, unspecified: Secondary | ICD-10-CM | POA: Diagnosis not present

## 2021-11-25 DIAGNOSIS — E1129 Type 2 diabetes mellitus with other diabetic kidney complication: Secondary | ICD-10-CM | POA: Diagnosis not present

## 2021-11-25 DIAGNOSIS — I129 Hypertensive chronic kidney disease with stage 1 through stage 4 chronic kidney disease, or unspecified chronic kidney disease: Secondary | ICD-10-CM | POA: Diagnosis not present

## 2021-11-25 DIAGNOSIS — E875 Hyperkalemia: Secondary | ICD-10-CM | POA: Diagnosis not present

## 2021-11-25 DIAGNOSIS — D638 Anemia in other chronic diseases classified elsewhere: Secondary | ICD-10-CM | POA: Diagnosis not present

## 2021-11-25 DIAGNOSIS — N189 Chronic kidney disease, unspecified: Secondary | ICD-10-CM | POA: Diagnosis not present

## 2021-11-25 DIAGNOSIS — E1122 Type 2 diabetes mellitus with diabetic chronic kidney disease: Secondary | ICD-10-CM | POA: Diagnosis not present

## 2021-11-26 DIAGNOSIS — Z87891 Personal history of nicotine dependence: Secondary | ICD-10-CM | POA: Diagnosis not present

## 2021-11-26 DIAGNOSIS — I10 Essential (primary) hypertension: Secondary | ICD-10-CM | POA: Diagnosis not present

## 2021-11-26 DIAGNOSIS — Z6824 Body mass index (BMI) 24.0-24.9, adult: Secondary | ICD-10-CM | POA: Diagnosis not present

## 2021-11-26 DIAGNOSIS — E1129 Type 2 diabetes mellitus with other diabetic kidney complication: Secondary | ICD-10-CM | POA: Diagnosis not present

## 2021-11-26 DIAGNOSIS — Z299 Encounter for prophylactic measures, unspecified: Secondary | ICD-10-CM | POA: Diagnosis not present

## 2021-11-26 DIAGNOSIS — G5601 Carpal tunnel syndrome, right upper limb: Secondary | ICD-10-CM | POA: Diagnosis not present

## 2021-11-27 DIAGNOSIS — I10 Essential (primary) hypertension: Secondary | ICD-10-CM | POA: Diagnosis not present

## 2021-11-27 DIAGNOSIS — E785 Hyperlipidemia, unspecified: Secondary | ICD-10-CM | POA: Diagnosis not present

## 2021-12-02 DIAGNOSIS — I129 Hypertensive chronic kidney disease with stage 1 through stage 4 chronic kidney disease, or unspecified chronic kidney disease: Secondary | ICD-10-CM | POA: Diagnosis not present

## 2021-12-02 DIAGNOSIS — N189 Chronic kidney disease, unspecified: Secondary | ICD-10-CM | POA: Diagnosis not present

## 2021-12-02 DIAGNOSIS — E875 Hyperkalemia: Secondary | ICD-10-CM | POA: Diagnosis not present

## 2021-12-02 DIAGNOSIS — E210 Primary hyperparathyroidism: Secondary | ICD-10-CM | POA: Diagnosis not present

## 2021-12-02 DIAGNOSIS — E1122 Type 2 diabetes mellitus with diabetic chronic kidney disease: Secondary | ICD-10-CM | POA: Diagnosis not present

## 2021-12-09 DIAGNOSIS — L89892 Pressure ulcer of other site, stage 2: Secondary | ICD-10-CM | POA: Diagnosis not present

## 2021-12-09 DIAGNOSIS — L110 Acquired keratosis follicularis: Secondary | ICD-10-CM | POA: Diagnosis not present

## 2021-12-09 DIAGNOSIS — M79672 Pain in left foot: Secondary | ICD-10-CM | POA: Diagnosis not present

## 2021-12-09 DIAGNOSIS — E1140 Type 2 diabetes mellitus with diabetic neuropathy, unspecified: Secondary | ICD-10-CM | POA: Diagnosis not present

## 2021-12-09 DIAGNOSIS — M79674 Pain in right toe(s): Secondary | ICD-10-CM | POA: Diagnosis not present

## 2021-12-09 DIAGNOSIS — I739 Peripheral vascular disease, unspecified: Secondary | ICD-10-CM | POA: Diagnosis not present

## 2021-12-09 DIAGNOSIS — M79671 Pain in right foot: Secondary | ICD-10-CM | POA: Diagnosis not present

## 2021-12-11 DIAGNOSIS — E1122 Type 2 diabetes mellitus with diabetic chronic kidney disease: Secondary | ICD-10-CM | POA: Diagnosis not present

## 2021-12-11 DIAGNOSIS — E875 Hyperkalemia: Secondary | ICD-10-CM | POA: Diagnosis not present

## 2021-12-11 DIAGNOSIS — E210 Primary hyperparathyroidism: Secondary | ICD-10-CM | POA: Diagnosis not present

## 2021-12-11 DIAGNOSIS — N189 Chronic kidney disease, unspecified: Secondary | ICD-10-CM | POA: Diagnosis not present

## 2021-12-25 DIAGNOSIS — E1140 Type 2 diabetes mellitus with diabetic neuropathy, unspecified: Secondary | ICD-10-CM | POA: Diagnosis not present

## 2021-12-25 DIAGNOSIS — L89892 Pressure ulcer of other site, stage 2: Secondary | ICD-10-CM | POA: Diagnosis not present

## 2021-12-25 DIAGNOSIS — M79671 Pain in right foot: Secondary | ICD-10-CM | POA: Diagnosis not present

## 2021-12-25 DIAGNOSIS — I739 Peripheral vascular disease, unspecified: Secondary | ICD-10-CM | POA: Diagnosis not present

## 2021-12-25 DIAGNOSIS — M79674 Pain in right toe(s): Secondary | ICD-10-CM | POA: Diagnosis not present

## 2022-01-15 DIAGNOSIS — E1165 Type 2 diabetes mellitus with hyperglycemia: Secondary | ICD-10-CM | POA: Diagnosis not present

## 2022-01-15 DIAGNOSIS — E1122 Type 2 diabetes mellitus with diabetic chronic kidney disease: Secondary | ICD-10-CM | POA: Diagnosis not present

## 2022-01-15 DIAGNOSIS — I10 Essential (primary) hypertension: Secondary | ICD-10-CM | POA: Diagnosis not present

## 2022-01-15 DIAGNOSIS — Z299 Encounter for prophylactic measures, unspecified: Secondary | ICD-10-CM | POA: Diagnosis not present

## 2022-01-15 DIAGNOSIS — Z6824 Body mass index (BMI) 24.0-24.9, adult: Secondary | ICD-10-CM | POA: Diagnosis not present

## 2022-01-20 DIAGNOSIS — Z794 Long term (current) use of insulin: Secondary | ICD-10-CM | POA: Diagnosis not present

## 2022-01-20 DIAGNOSIS — E1165 Type 2 diabetes mellitus with hyperglycemia: Secondary | ICD-10-CM | POA: Diagnosis not present

## 2022-02-05 DIAGNOSIS — E875 Hyperkalemia: Secondary | ICD-10-CM | POA: Diagnosis not present

## 2022-02-05 DIAGNOSIS — N189 Chronic kidney disease, unspecified: Secondary | ICD-10-CM | POA: Diagnosis not present

## 2022-02-05 DIAGNOSIS — E1122 Type 2 diabetes mellitus with diabetic chronic kidney disease: Secondary | ICD-10-CM | POA: Diagnosis not present

## 2022-02-05 DIAGNOSIS — I129 Hypertensive chronic kidney disease with stage 1 through stage 4 chronic kidney disease, or unspecified chronic kidney disease: Secondary | ICD-10-CM | POA: Diagnosis not present

## 2022-02-05 DIAGNOSIS — E210 Primary hyperparathyroidism: Secondary | ICD-10-CM | POA: Diagnosis not present

## 2022-02-10 DIAGNOSIS — E1165 Type 2 diabetes mellitus with hyperglycemia: Secondary | ICD-10-CM | POA: Diagnosis not present

## 2022-02-10 DIAGNOSIS — I10 Essential (primary) hypertension: Secondary | ICD-10-CM | POA: Diagnosis not present

## 2022-02-10 DIAGNOSIS — Z6824 Body mass index (BMI) 24.0-24.9, adult: Secondary | ICD-10-CM | POA: Diagnosis not present

## 2022-02-10 DIAGNOSIS — Z713 Dietary counseling and surveillance: Secondary | ICD-10-CM | POA: Diagnosis not present

## 2022-02-10 DIAGNOSIS — Z299 Encounter for prophylactic measures, unspecified: Secondary | ICD-10-CM | POA: Diagnosis not present

## 2022-02-12 DIAGNOSIS — E1129 Type 2 diabetes mellitus with other diabetic kidney complication: Secondary | ICD-10-CM | POA: Diagnosis not present

## 2022-02-12 DIAGNOSIS — N189 Chronic kidney disease, unspecified: Secondary | ICD-10-CM | POA: Diagnosis not present

## 2022-02-12 DIAGNOSIS — E210 Primary hyperparathyroidism: Secondary | ICD-10-CM | POA: Diagnosis not present

## 2022-02-12 DIAGNOSIS — R809 Proteinuria, unspecified: Secondary | ICD-10-CM | POA: Diagnosis not present

## 2022-02-12 DIAGNOSIS — D638 Anemia in other chronic diseases classified elsewhere: Secondary | ICD-10-CM | POA: Diagnosis not present

## 2022-02-12 DIAGNOSIS — E1122 Type 2 diabetes mellitus with diabetic chronic kidney disease: Secondary | ICD-10-CM | POA: Diagnosis not present

## 2022-02-12 DIAGNOSIS — I129 Hypertensive chronic kidney disease with stage 1 through stage 4 chronic kidney disease, or unspecified chronic kidney disease: Secondary | ICD-10-CM | POA: Diagnosis not present

## 2022-02-12 DIAGNOSIS — E875 Hyperkalemia: Secondary | ICD-10-CM | POA: Diagnosis not present

## 2022-02-27 DIAGNOSIS — Z299 Encounter for prophylactic measures, unspecified: Secondary | ICD-10-CM | POA: Diagnosis not present

## 2022-02-27 DIAGNOSIS — E1165 Type 2 diabetes mellitus with hyperglycemia: Secondary | ICD-10-CM | POA: Diagnosis not present

## 2022-02-27 DIAGNOSIS — Z6824 Body mass index (BMI) 24.0-24.9, adult: Secondary | ICD-10-CM | POA: Diagnosis not present

## 2022-02-27 DIAGNOSIS — Z713 Dietary counseling and surveillance: Secondary | ICD-10-CM | POA: Diagnosis not present

## 2022-02-27 DIAGNOSIS — I10 Essential (primary) hypertension: Secondary | ICD-10-CM | POA: Diagnosis not present

## 2022-03-17 DIAGNOSIS — M79672 Pain in left foot: Secondary | ICD-10-CM | POA: Diagnosis not present

## 2022-03-17 DIAGNOSIS — I739 Peripheral vascular disease, unspecified: Secondary | ICD-10-CM | POA: Diagnosis not present

## 2022-03-17 DIAGNOSIS — M79671 Pain in right foot: Secondary | ICD-10-CM | POA: Diagnosis not present

## 2022-03-17 DIAGNOSIS — L110 Acquired keratosis follicularis: Secondary | ICD-10-CM | POA: Diagnosis not present

## 2022-03-17 DIAGNOSIS — E1140 Type 2 diabetes mellitus with diabetic neuropathy, unspecified: Secondary | ICD-10-CM | POA: Diagnosis not present

## 2022-04-21 DIAGNOSIS — I10 Essential (primary) hypertension: Secondary | ICD-10-CM | POA: Diagnosis not present

## 2022-04-21 DIAGNOSIS — E1165 Type 2 diabetes mellitus with hyperglycemia: Secondary | ICD-10-CM | POA: Diagnosis not present

## 2022-04-21 DIAGNOSIS — Z299 Encounter for prophylactic measures, unspecified: Secondary | ICD-10-CM | POA: Diagnosis not present

## 2022-05-15 DIAGNOSIS — E210 Primary hyperparathyroidism: Secondary | ICD-10-CM | POA: Diagnosis not present

## 2022-05-15 DIAGNOSIS — E1122 Type 2 diabetes mellitus with diabetic chronic kidney disease: Secondary | ICD-10-CM | POA: Diagnosis not present

## 2022-05-15 DIAGNOSIS — E875 Hyperkalemia: Secondary | ICD-10-CM | POA: Diagnosis not present

## 2022-05-15 DIAGNOSIS — N189 Chronic kidney disease, unspecified: Secondary | ICD-10-CM | POA: Diagnosis not present

## 2022-05-15 DIAGNOSIS — E1129 Type 2 diabetes mellitus with other diabetic kidney complication: Secondary | ICD-10-CM | POA: Diagnosis not present

## 2022-05-19 DIAGNOSIS — N189 Chronic kidney disease, unspecified: Secondary | ICD-10-CM | POA: Diagnosis not present

## 2022-05-19 DIAGNOSIS — E1122 Type 2 diabetes mellitus with diabetic chronic kidney disease: Secondary | ICD-10-CM | POA: Diagnosis not present

## 2022-05-19 DIAGNOSIS — E875 Hyperkalemia: Secondary | ICD-10-CM | POA: Diagnosis not present

## 2022-05-19 DIAGNOSIS — D472 Monoclonal gammopathy: Secondary | ICD-10-CM | POA: Diagnosis not present

## 2022-05-21 DIAGNOSIS — R809 Proteinuria, unspecified: Secondary | ICD-10-CM | POA: Diagnosis not present

## 2022-05-21 DIAGNOSIS — E875 Hyperkalemia: Secondary | ICD-10-CM | POA: Diagnosis not present

## 2022-05-21 DIAGNOSIS — N189 Chronic kidney disease, unspecified: Secondary | ICD-10-CM | POA: Diagnosis not present

## 2022-05-21 DIAGNOSIS — E1129 Type 2 diabetes mellitus with other diabetic kidney complication: Secondary | ICD-10-CM | POA: Diagnosis not present

## 2022-05-21 DIAGNOSIS — E210 Primary hyperparathyroidism: Secondary | ICD-10-CM | POA: Diagnosis not present

## 2022-05-21 DIAGNOSIS — D638 Anemia in other chronic diseases classified elsewhere: Secondary | ICD-10-CM | POA: Diagnosis not present

## 2022-05-21 DIAGNOSIS — E1122 Type 2 diabetes mellitus with diabetic chronic kidney disease: Secondary | ICD-10-CM | POA: Diagnosis not present

## 2022-05-21 DIAGNOSIS — I129 Hypertensive chronic kidney disease with stage 1 through stage 4 chronic kidney disease, or unspecified chronic kidney disease: Secondary | ICD-10-CM | POA: Diagnosis not present

## 2022-06-09 DIAGNOSIS — I129 Hypertensive chronic kidney disease with stage 1 through stage 4 chronic kidney disease, or unspecified chronic kidney disease: Secondary | ICD-10-CM | POA: Diagnosis not present

## 2022-06-09 DIAGNOSIS — E875 Hyperkalemia: Secondary | ICD-10-CM | POA: Diagnosis not present

## 2022-06-09 DIAGNOSIS — E1122 Type 2 diabetes mellitus with diabetic chronic kidney disease: Secondary | ICD-10-CM | POA: Diagnosis not present

## 2022-06-09 DIAGNOSIS — E210 Primary hyperparathyroidism: Secondary | ICD-10-CM | POA: Diagnosis not present

## 2022-06-09 DIAGNOSIS — N189 Chronic kidney disease, unspecified: Secondary | ICD-10-CM | POA: Diagnosis not present

## 2022-06-16 DIAGNOSIS — I739 Peripheral vascular disease, unspecified: Secondary | ICD-10-CM | POA: Diagnosis not present

## 2022-06-16 DIAGNOSIS — M79672 Pain in left foot: Secondary | ICD-10-CM | POA: Diagnosis not present

## 2022-06-16 DIAGNOSIS — E1140 Type 2 diabetes mellitus with diabetic neuropathy, unspecified: Secondary | ICD-10-CM | POA: Diagnosis not present

## 2022-06-16 DIAGNOSIS — M79671 Pain in right foot: Secondary | ICD-10-CM | POA: Diagnosis not present

## 2022-06-16 DIAGNOSIS — L110 Acquired keratosis follicularis: Secondary | ICD-10-CM | POA: Diagnosis not present

## 2022-07-28 DIAGNOSIS — E1165 Type 2 diabetes mellitus with hyperglycemia: Secondary | ICD-10-CM | POA: Diagnosis not present

## 2022-07-28 DIAGNOSIS — I10 Essential (primary) hypertension: Secondary | ICD-10-CM | POA: Diagnosis not present

## 2022-07-28 DIAGNOSIS — Z299 Encounter for prophylactic measures, unspecified: Secondary | ICD-10-CM | POA: Diagnosis not present

## 2022-07-28 DIAGNOSIS — Z23 Encounter for immunization: Secondary | ICD-10-CM | POA: Diagnosis not present

## 2022-08-03 DIAGNOSIS — Z0000 Encounter for general adult medical examination without abnormal findings: Secondary | ICD-10-CM | POA: Diagnosis not present

## 2022-08-03 DIAGNOSIS — Z299 Encounter for prophylactic measures, unspecified: Secondary | ICD-10-CM | POA: Diagnosis not present

## 2022-08-03 DIAGNOSIS — Z7189 Other specified counseling: Secondary | ICD-10-CM | POA: Diagnosis not present

## 2022-08-03 DIAGNOSIS — I10 Essential (primary) hypertension: Secondary | ICD-10-CM | POA: Diagnosis not present

## 2022-08-03 DIAGNOSIS — E7800 Pure hypercholesterolemia, unspecified: Secondary | ICD-10-CM | POA: Diagnosis not present

## 2022-08-03 DIAGNOSIS — R5383 Other fatigue: Secondary | ICD-10-CM | POA: Diagnosis not present

## 2022-08-03 DIAGNOSIS — Z79899 Other long term (current) drug therapy: Secondary | ICD-10-CM | POA: Diagnosis not present

## 2022-08-03 DIAGNOSIS — Z6822 Body mass index (BMI) 22.0-22.9, adult: Secondary | ICD-10-CM | POA: Diagnosis not present

## 2022-08-05 DIAGNOSIS — R7989 Other specified abnormal findings of blood chemistry: Secondary | ICD-10-CM | POA: Diagnosis not present

## 2022-08-05 DIAGNOSIS — Z299 Encounter for prophylactic measures, unspecified: Secondary | ICD-10-CM | POA: Diagnosis not present

## 2022-08-05 DIAGNOSIS — I10 Essential (primary) hypertension: Secondary | ICD-10-CM | POA: Diagnosis not present

## 2022-08-05 DIAGNOSIS — J449 Chronic obstructive pulmonary disease, unspecified: Secondary | ICD-10-CM | POA: Diagnosis not present

## 2022-08-05 DIAGNOSIS — E1165 Type 2 diabetes mellitus with hyperglycemia: Secondary | ICD-10-CM | POA: Diagnosis not present

## 2022-08-06 DIAGNOSIS — E860 Dehydration: Secondary | ICD-10-CM | POA: Diagnosis not present

## 2022-08-06 DIAGNOSIS — Z794 Long term (current) use of insulin: Secondary | ICD-10-CM | POA: Diagnosis not present

## 2022-08-06 DIAGNOSIS — Z7984 Long term (current) use of oral hypoglycemic drugs: Secondary | ICD-10-CM | POA: Diagnosis not present

## 2022-08-06 DIAGNOSIS — R634 Abnormal weight loss: Secondary | ICD-10-CM | POA: Diagnosis not present

## 2022-08-06 DIAGNOSIS — I10 Essential (primary) hypertension: Secondary | ICD-10-CM | POA: Diagnosis not present

## 2022-08-06 DIAGNOSIS — Z881 Allergy status to other antibiotic agents status: Secondary | ICD-10-CM | POA: Diagnosis not present

## 2022-08-06 DIAGNOSIS — Z66 Do not resuscitate: Secondary | ICD-10-CM | POA: Diagnosis not present

## 2022-08-06 DIAGNOSIS — T383X6A Underdosing of insulin and oral hypoglycemic [antidiabetic] drugs, initial encounter: Secondary | ICD-10-CM | POA: Diagnosis not present

## 2022-08-06 DIAGNOSIS — Z880 Allergy status to penicillin: Secondary | ICD-10-CM | POA: Diagnosis not present

## 2022-08-06 DIAGNOSIS — E869 Volume depletion, unspecified: Secondary | ICD-10-CM | POA: Diagnosis not present

## 2022-08-06 DIAGNOSIS — E871 Hypo-osmolality and hyponatremia: Secondary | ICD-10-CM | POA: Diagnosis not present

## 2022-08-06 DIAGNOSIS — E1165 Type 2 diabetes mellitus with hyperglycemia: Secondary | ICD-10-CM | POA: Diagnosis not present

## 2022-08-06 DIAGNOSIS — Z79899 Other long term (current) drug therapy: Secondary | ICD-10-CM | POA: Diagnosis not present

## 2022-08-06 DIAGNOSIS — N179 Acute kidney failure, unspecified: Secondary | ICD-10-CM | POA: Diagnosis not present

## 2022-08-06 DIAGNOSIS — R531 Weakness: Secondary | ICD-10-CM | POA: Diagnosis not present

## 2022-08-06 DIAGNOSIS — Z7982 Long term (current) use of aspirin: Secondary | ICD-10-CM | POA: Diagnosis not present

## 2022-08-14 DIAGNOSIS — R809 Proteinuria, unspecified: Secondary | ICD-10-CM | POA: Diagnosis not present

## 2022-08-14 DIAGNOSIS — E875 Hyperkalemia: Secondary | ICD-10-CM | POA: Diagnosis not present

## 2022-08-14 DIAGNOSIS — N189 Chronic kidney disease, unspecified: Secondary | ICD-10-CM | POA: Diagnosis not present

## 2022-08-14 DIAGNOSIS — E210 Primary hyperparathyroidism: Secondary | ICD-10-CM | POA: Diagnosis not present

## 2022-08-14 DIAGNOSIS — E1122 Type 2 diabetes mellitus with diabetic chronic kidney disease: Secondary | ICD-10-CM | POA: Diagnosis not present

## 2022-08-14 DIAGNOSIS — I129 Hypertensive chronic kidney disease with stage 1 through stage 4 chronic kidney disease, or unspecified chronic kidney disease: Secondary | ICD-10-CM | POA: Diagnosis not present

## 2022-08-14 DIAGNOSIS — E1129 Type 2 diabetes mellitus with other diabetic kidney complication: Secondary | ICD-10-CM | POA: Diagnosis not present

## 2022-08-14 DIAGNOSIS — D638 Anemia in other chronic diseases classified elsewhere: Secondary | ICD-10-CM | POA: Diagnosis not present

## 2022-08-17 DIAGNOSIS — Z299 Encounter for prophylactic measures, unspecified: Secondary | ICD-10-CM | POA: Diagnosis not present

## 2022-08-17 DIAGNOSIS — E1122 Type 2 diabetes mellitus with diabetic chronic kidney disease: Secondary | ICD-10-CM | POA: Diagnosis not present

## 2022-08-17 DIAGNOSIS — E7800 Pure hypercholesterolemia, unspecified: Secondary | ICD-10-CM | POA: Diagnosis not present

## 2022-08-17 DIAGNOSIS — N183 Chronic kidney disease, stage 3 unspecified: Secondary | ICD-10-CM | POA: Diagnosis not present

## 2022-08-17 DIAGNOSIS — Z6821 Body mass index (BMI) 21.0-21.9, adult: Secondary | ICD-10-CM | POA: Diagnosis not present

## 2022-08-17 DIAGNOSIS — I10 Essential (primary) hypertension: Secondary | ICD-10-CM | POA: Diagnosis not present

## 2022-08-19 DIAGNOSIS — E1140 Type 2 diabetes mellitus with diabetic neuropathy, unspecified: Secondary | ICD-10-CM | POA: Diagnosis not present

## 2022-08-19 DIAGNOSIS — I10 Essential (primary) hypertension: Secondary | ICD-10-CM | POA: Diagnosis not present

## 2022-08-19 DIAGNOSIS — Z299 Encounter for prophylactic measures, unspecified: Secondary | ICD-10-CM | POA: Diagnosis not present

## 2022-08-28 DIAGNOSIS — E1165 Type 2 diabetes mellitus with hyperglycemia: Secondary | ICD-10-CM | POA: Diagnosis not present

## 2022-08-28 DIAGNOSIS — Z794 Long term (current) use of insulin: Secondary | ICD-10-CM | POA: Diagnosis not present

## 2022-09-10 DIAGNOSIS — Z794 Long term (current) use of insulin: Secondary | ICD-10-CM | POA: Diagnosis not present

## 2022-09-10 DIAGNOSIS — Z961 Presence of intraocular lens: Secondary | ICD-10-CM | POA: Diagnosis not present

## 2022-09-10 DIAGNOSIS — E103293 Type 1 diabetes mellitus with mild nonproliferative diabetic retinopathy without macular edema, bilateral: Secondary | ICD-10-CM | POA: Diagnosis not present

## 2022-09-11 DIAGNOSIS — E1165 Type 2 diabetes mellitus with hyperglycemia: Secondary | ICD-10-CM | POA: Diagnosis not present

## 2022-09-11 DIAGNOSIS — J449 Chronic obstructive pulmonary disease, unspecified: Secondary | ICD-10-CM | POA: Diagnosis not present

## 2022-09-11 DIAGNOSIS — Z299 Encounter for prophylactic measures, unspecified: Secondary | ICD-10-CM | POA: Diagnosis not present

## 2022-09-11 DIAGNOSIS — N2581 Secondary hyperparathyroidism of renal origin: Secondary | ICD-10-CM | POA: Diagnosis not present

## 2022-09-11 DIAGNOSIS — I10 Essential (primary) hypertension: Secondary | ICD-10-CM | POA: Diagnosis not present

## 2022-09-15 DIAGNOSIS — I739 Peripheral vascular disease, unspecified: Secondary | ICD-10-CM | POA: Diagnosis not present

## 2022-09-15 DIAGNOSIS — E1140 Type 2 diabetes mellitus with diabetic neuropathy, unspecified: Secondary | ICD-10-CM | POA: Diagnosis not present

## 2022-09-15 DIAGNOSIS — M79672 Pain in left foot: Secondary | ICD-10-CM | POA: Diagnosis not present

## 2022-09-15 DIAGNOSIS — M79671 Pain in right foot: Secondary | ICD-10-CM | POA: Diagnosis not present

## 2022-09-15 DIAGNOSIS — L110 Acquired keratosis follicularis: Secondary | ICD-10-CM | POA: Diagnosis not present

## 2022-09-17 DIAGNOSIS — N189 Chronic kidney disease, unspecified: Secondary | ICD-10-CM | POA: Diagnosis not present

## 2022-09-17 DIAGNOSIS — E1129 Type 2 diabetes mellitus with other diabetic kidney complication: Secondary | ICD-10-CM | POA: Diagnosis not present

## 2022-09-17 DIAGNOSIS — E1122 Type 2 diabetes mellitus with diabetic chronic kidney disease: Secondary | ICD-10-CM | POA: Diagnosis not present

## 2022-09-17 DIAGNOSIS — E210 Primary hyperparathyroidism: Secondary | ICD-10-CM | POA: Diagnosis not present

## 2022-09-18 DIAGNOSIS — E875 Hyperkalemia: Secondary | ICD-10-CM | POA: Diagnosis not present

## 2022-09-18 DIAGNOSIS — E1122 Type 2 diabetes mellitus with diabetic chronic kidney disease: Secondary | ICD-10-CM | POA: Diagnosis not present

## 2022-09-18 DIAGNOSIS — N189 Chronic kidney disease, unspecified: Secondary | ICD-10-CM | POA: Diagnosis not present

## 2022-09-18 DIAGNOSIS — I129 Hypertensive chronic kidney disease with stage 1 through stage 4 chronic kidney disease, or unspecified chronic kidney disease: Secondary | ICD-10-CM | POA: Diagnosis not present

## 2022-09-18 DIAGNOSIS — E210 Primary hyperparathyroidism: Secondary | ICD-10-CM | POA: Diagnosis not present

## 2022-09-21 DIAGNOSIS — J449 Chronic obstructive pulmonary disease, unspecified: Secondary | ICD-10-CM | POA: Diagnosis not present

## 2022-09-21 DIAGNOSIS — E7800 Pure hypercholesterolemia, unspecified: Secondary | ICD-10-CM | POA: Diagnosis not present

## 2022-09-21 DIAGNOSIS — Z299 Encounter for prophylactic measures, unspecified: Secondary | ICD-10-CM | POA: Diagnosis not present

## 2022-09-21 DIAGNOSIS — E1165 Type 2 diabetes mellitus with hyperglycemia: Secondary | ICD-10-CM | POA: Diagnosis not present

## 2022-09-22 DIAGNOSIS — R809 Proteinuria, unspecified: Secondary | ICD-10-CM | POA: Diagnosis not present

## 2022-09-22 DIAGNOSIS — E1122 Type 2 diabetes mellitus with diabetic chronic kidney disease: Secondary | ICD-10-CM | POA: Diagnosis not present

## 2022-09-22 DIAGNOSIS — E875 Hyperkalemia: Secondary | ICD-10-CM | POA: Diagnosis not present

## 2022-09-22 DIAGNOSIS — I129 Hypertensive chronic kidney disease with stage 1 through stage 4 chronic kidney disease, or unspecified chronic kidney disease: Secondary | ICD-10-CM | POA: Diagnosis not present

## 2022-09-22 DIAGNOSIS — D638 Anemia in other chronic diseases classified elsewhere: Secondary | ICD-10-CM | POA: Diagnosis not present

## 2022-09-22 DIAGNOSIS — E1129 Type 2 diabetes mellitus with other diabetic kidney complication: Secondary | ICD-10-CM | POA: Diagnosis not present

## 2022-09-22 DIAGNOSIS — N189 Chronic kidney disease, unspecified: Secondary | ICD-10-CM | POA: Diagnosis not present

## 2022-09-22 DIAGNOSIS — E210 Primary hyperparathyroidism: Secondary | ICD-10-CM | POA: Diagnosis not present

## 2022-11-02 DIAGNOSIS — I10 Essential (primary) hypertension: Secondary | ICD-10-CM | POA: Diagnosis not present

## 2022-11-02 DIAGNOSIS — Z794 Long term (current) use of insulin: Secondary | ICD-10-CM | POA: Diagnosis not present

## 2022-11-02 DIAGNOSIS — Z299 Encounter for prophylactic measures, unspecified: Secondary | ICD-10-CM | POA: Diagnosis not present

## 2022-11-02 DIAGNOSIS — E1122 Type 2 diabetes mellitus with diabetic chronic kidney disease: Secondary | ICD-10-CM | POA: Diagnosis not present

## 2022-11-02 DIAGNOSIS — E1165 Type 2 diabetes mellitus with hyperglycemia: Secondary | ICD-10-CM | POA: Diagnosis not present

## 2022-11-26 DIAGNOSIS — Z794 Long term (current) use of insulin: Secondary | ICD-10-CM | POA: Diagnosis not present

## 2022-11-26 DIAGNOSIS — E1165 Type 2 diabetes mellitus with hyperglycemia: Secondary | ICD-10-CM | POA: Diagnosis not present

## 2022-12-21 DIAGNOSIS — E1165 Type 2 diabetes mellitus with hyperglycemia: Secondary | ICD-10-CM | POA: Diagnosis not present

## 2022-12-21 DIAGNOSIS — I10 Essential (primary) hypertension: Secondary | ICD-10-CM | POA: Diagnosis not present

## 2022-12-21 DIAGNOSIS — E1140 Type 2 diabetes mellitus with diabetic neuropathy, unspecified: Secondary | ICD-10-CM | POA: Diagnosis not present

## 2022-12-21 DIAGNOSIS — Z299 Encounter for prophylactic measures, unspecified: Secondary | ICD-10-CM | POA: Diagnosis not present

## 2022-12-21 DIAGNOSIS — M2040 Other hammer toe(s) (acquired), unspecified foot: Secondary | ICD-10-CM | POA: Diagnosis not present

## 2022-12-22 DIAGNOSIS — M79672 Pain in left foot: Secondary | ICD-10-CM | POA: Diagnosis not present

## 2022-12-22 DIAGNOSIS — I739 Peripheral vascular disease, unspecified: Secondary | ICD-10-CM | POA: Diagnosis not present

## 2022-12-22 DIAGNOSIS — L110 Acquired keratosis follicularis: Secondary | ICD-10-CM | POA: Diagnosis not present

## 2022-12-22 DIAGNOSIS — E1140 Type 2 diabetes mellitus with diabetic neuropathy, unspecified: Secondary | ICD-10-CM | POA: Diagnosis not present

## 2022-12-22 DIAGNOSIS — M79671 Pain in right foot: Secondary | ICD-10-CM | POA: Diagnosis not present

## 2022-12-24 DIAGNOSIS — E210 Primary hyperparathyroidism: Secondary | ICD-10-CM | POA: Diagnosis not present

## 2022-12-24 DIAGNOSIS — E875 Hyperkalemia: Secondary | ICD-10-CM | POA: Diagnosis not present

## 2022-12-24 DIAGNOSIS — N189 Chronic kidney disease, unspecified: Secondary | ICD-10-CM | POA: Diagnosis not present

## 2022-12-24 DIAGNOSIS — E1122 Type 2 diabetes mellitus with diabetic chronic kidney disease: Secondary | ICD-10-CM | POA: Diagnosis not present

## 2022-12-24 DIAGNOSIS — I129 Hypertensive chronic kidney disease with stage 1 through stage 4 chronic kidney disease, or unspecified chronic kidney disease: Secondary | ICD-10-CM | POA: Diagnosis not present

## 2022-12-30 DIAGNOSIS — E875 Hyperkalemia: Secondary | ICD-10-CM | POA: Diagnosis not present

## 2022-12-30 DIAGNOSIS — E1122 Type 2 diabetes mellitus with diabetic chronic kidney disease: Secondary | ICD-10-CM | POA: Diagnosis not present

## 2022-12-30 DIAGNOSIS — N189 Chronic kidney disease, unspecified: Secondary | ICD-10-CM | POA: Diagnosis not present

## 2023-01-07 DIAGNOSIS — E1165 Type 2 diabetes mellitus with hyperglycemia: Secondary | ICD-10-CM | POA: Diagnosis not present

## 2023-01-18 ENCOUNTER — Encounter: Payer: Self-pay | Admitting: "Endocrinology

## 2023-02-09 DIAGNOSIS — I10 Essential (primary) hypertension: Secondary | ICD-10-CM | POA: Diagnosis not present

## 2023-02-09 DIAGNOSIS — M810 Age-related osteoporosis without current pathological fracture: Secondary | ICD-10-CM | POA: Diagnosis not present

## 2023-02-09 DIAGNOSIS — E1165 Type 2 diabetes mellitus with hyperglycemia: Secondary | ICD-10-CM | POA: Diagnosis not present

## 2023-02-09 DIAGNOSIS — J449 Chronic obstructive pulmonary disease, unspecified: Secondary | ICD-10-CM | POA: Diagnosis not present

## 2023-02-09 DIAGNOSIS — Z299 Encounter for prophylactic measures, unspecified: Secondary | ICD-10-CM | POA: Diagnosis not present

## 2023-02-09 DIAGNOSIS — Z7189 Other specified counseling: Secondary | ICD-10-CM | POA: Diagnosis not present

## 2023-02-09 DIAGNOSIS — Z0000 Encounter for general adult medical examination without abnormal findings: Secondary | ICD-10-CM | POA: Diagnosis not present

## 2023-02-19 DIAGNOSIS — E1165 Type 2 diabetes mellitus with hyperglycemia: Secondary | ICD-10-CM | POA: Diagnosis not present

## 2023-02-23 DIAGNOSIS — I129 Hypertensive chronic kidney disease with stage 1 through stage 4 chronic kidney disease, or unspecified chronic kidney disease: Secondary | ICD-10-CM | POA: Diagnosis not present

## 2023-02-23 DIAGNOSIS — R809 Proteinuria, unspecified: Secondary | ICD-10-CM | POA: Diagnosis not present

## 2023-02-23 DIAGNOSIS — N1832 Chronic kidney disease, stage 3b: Secondary | ICD-10-CM | POA: Diagnosis not present

## 2023-02-23 DIAGNOSIS — D631 Anemia in chronic kidney disease: Secondary | ICD-10-CM | POA: Diagnosis not present

## 2023-03-02 ENCOUNTER — Ambulatory Visit (INDEPENDENT_AMBULATORY_CARE_PROVIDER_SITE_OTHER): Payer: 59 | Admitting: "Endocrinology

## 2023-03-02 ENCOUNTER — Encounter: Payer: Self-pay | Admitting: "Endocrinology

## 2023-03-02 VITALS — BP 128/54 | HR 68 | Ht 68.0 in | Wt 156.0 lb

## 2023-03-02 DIAGNOSIS — E213 Hyperparathyroidism, unspecified: Secondary | ICD-10-CM | POA: Diagnosis not present

## 2023-03-02 DIAGNOSIS — N183 Chronic kidney disease, stage 3 unspecified: Secondary | ICD-10-CM | POA: Insufficient documentation

## 2023-03-02 DIAGNOSIS — Z7984 Long term (current) use of oral hypoglycemic drugs: Secondary | ICD-10-CM

## 2023-03-02 DIAGNOSIS — E1122 Type 2 diabetes mellitus with diabetic chronic kidney disease: Secondary | ICD-10-CM | POA: Diagnosis not present

## 2023-03-02 MED ORDER — CINACALCET HCL 30 MG PO TABS: 30.0000 mg | ORAL_TABLET | Freq: Every day | ORAL | 1 refills | Status: DC

## 2023-03-02 NOTE — Patient Instructions (Signed)

## 2023-03-02 NOTE — Progress Notes (Signed)
Endocrinology Consult Note                                            03/02/2023, 4:22 PM   Subjective:    Patient ID: Jennifer Anthony, female    DOB: 02/22/1941, PCP Kirstie Peri, MD   Past Medical History:  Diagnosis Date   Adenocarcinoma of colon (H[facility])    CKD (chronic kidney disease), stage III (H[facility])    Diabetes mellitus    Diabetes mellitus, type II ([facility])    Hyperkalemia    Hypertension    Hypertension    Osteoarthritis    Past Surgical History:  Procedure Laterality Date   ABDOMINAL HYSTERECTOMY     ANKLE FRACTURE SURGERY     CATARACT EXTRACTION, BILATERAL     COLON SURGERY     Social History   Socioeconomic History   Marital status: Widowed    Spouse name: Not on file   Number of children: Not on file   Years of education: Not on file   Highest education level: Not on file  Occupational History   Not on file  Tobacco Use   Smoking status: Former    Packs/day: 0.50    Years: 20.00    Additional pack years: 0.00    Total pack years: 10.00    Types: Cigarettes    Quit date: 2000    Years since quitting: 24.5   Smokeless tobacco: Never  Vaping Use   Vaping Use: Never used  Substance and Sexual Activity   Alcohol use: No   Drug use: No   Sexual activity: Not on file    Comment: widowed  Other Topics Concern   Not on file  Social History Narrative   Not on file   Social Determinants of Health   Financial Resource Strain: Not on file  Food Insecurity: Not on file  Transportation Needs: Not on file  Physical Activity: Not on file  Stress: Not on file  Social Connections: Not on file   Family History  Problem Relation Age of Onset   Stroke Mother    Heart failure Mother    Cancer Father    Outpatient Encounter Medications as of 03/02/2023  Medication Sig   Cholecalciferol (VITAMIN D-3) 25 MCG (1000 UT) CAPS Take 1 capsule by mouth daily.   LANTUS SOLOSTAR 100 UNIT/ML Solostar Pen Inject 14 Units into the skin at bedtime.    [DISCONTINUED] OVER THE COUNTER MEDICATION Take 25 mg by mouth daily. Bone & Immune supplement   amLODipine (NORVASC) 2.5 MG tablet Take 5 mg by mouth daily.   aspirin 81 MG tablet Take 81 mg by mouth daily.     cinacalcet (SENSIPAR) 30 MG tablet Take 1 tablet (30 mg total) by mouth daily with breakfast.   FARXIGA 10 MG TABS tablet Take 10 mg by mouth daily.   insulin lispro (HUMALOG) 100 UNIT/ML KwikPen Inject 10 Units into the skin 3 (three) times daily before meals.    pravastatin (PRAVACHOL) 10 MG tablet Take 10 mg by mouth daily.   VELTASSA 8.4 g packet daily.   [DISCONTINUED] cinacalcet (SENSIPAR) 30 MG tablet Take 30 mg by mouth daily with breakfast.   [DISCONTINUED] cinacalcet (SENSIPAR) 60 MG tablet Take 60 mg by mouth at bedtime.   [DISCONTINUED] Insulin Detemir (LEVEMIR) 100 UNIT/ML Pen Inject 18 Units into the  skin at bedtime.   No facility-administered encounter medications on file as of 03/02/2023.   ALLERGIES: Allergies  Allergen Reactions   Ampicillin Swelling   Clindamycin/Lincomycin     VACCINATION STATUS:  There is no immunization history on file for this patient.  HPI Jennifer Anthony is 82 y.o. female who presents today with a medical history as above. she is being seen in consultation for hyperparathyroidism requested by Kirstie Peri, MD.  She was previously seen in this clinic for the same concern.  Her last visit was in June 2020. Subsequent to her last visit, she underwent parathyroid sestamibi scan which showed possible right lower parathyroid adenoma. She did not return for follow-up.  She continued to have mild to moderate hyperglycemia.  Patient also has CKD on nephrology care.  She is still on Sensipar currently 60 mg 3 days a week. Her most recent labs show calcium of 10.9, PTH of 278, BUN 36, creatinine 1.41 with a GFR of 37. She did not have bone density before, however scheduled to get it in the next few weeks. She denies acute complaints today. She  has type 2 diabetes on Farxiga 10 mg p.o. daily as well as Lantus 18 units nightly.  She complains of random hypoglycemic she wears a Dexcom CGM showing 68% time in range, 18% level 1 hyperglycemia.  She has 8% hypoglycemia.   Her recent A1c was reported to be 7%.  Review of Systems  Constitutional: + Mildly fluctuating body weight,  + fatigue, no subjective hyperthermia, no subjective hypothermia Eyes: no blurry vision, no xerophthalmia ENT: no sore throat, no nodules palpated in throat, no dysphagia/odynophagia, no hoarseness Cardiovascular: no Chest Pain, no Shortness of Breath, no palpitations, no leg swelling Respiratory: no cough, no shortness of breath Gastrointestinal: no Nausea/Vomiting/Diarhhea Musculoskeletal: no muscle/joint aches Skin: no rashes Neurological: no tremors, no numbness, no tingling, no dizziness Psychiatric: no depression, no anxiety  Objective:       03/02/2023    2:18 PM 01/31/2019   11:29 AM 01/10/2019   11:54 AM  Vitals with BMI  Height 5\' 8"  5\' 9"  5\' 9"   Weight 156 lbs 168 lbs 162 lbs  BMI 23.73 24.8 23.91  Systolic 128 161 161  Diastolic 54 75 67  Pulse 68  77    BP (!) 128/54   Pulse 68   Ht 5\' 8"  (1.727 m)   Wt 156 lb (70.8 kg)   BMI 23.72 kg/m   Wt Readings from Last 3 Encounters:  03/02/23 156 lb (70.8 kg)  01/31/19 168 lb (76.2 kg)  01/10/19 162 lb (73.5 kg)    Physical Exam  Constitutional:  Body mass index is 23.72 kg/m.,  not in acute distress, normal state of mind Eyes: PERRLA, EOMI, no exophthalmos ENT: moist mucous membranes, no gross thyromegaly, no gross cervical lymphadenopathy Cardiovascular: normal precordial activity, Regular Rate and Rhythm, no Murmur/Rubs/Gallops Respiratory:  adequate breathing efforts, no gross chest deformity, Clear to auscultation bilaterally Gastrointestinal: abdomen soft, Non -tender, No distension, Bowel Sounds present, no gross organomegaly Musculoskeletal: no gross deformities, strength intact  in all four extremities, no peripheral edema Skin: moist, warm, no rashes Neurological: no tremor with outstretched hands, Deep tendon reflexes normal in bilateral lower extremities.  Labs from December 24, 2022  BUN 7 - 25 mg/dL 36 High    Creatinine 0.96 - 0.95 mg/dL 0.45 High    eGFR CKD-EPI CR 2021 > OR = 60 mL/min/1.57m2 37 Low    BUN/Creatinine Ratio 6 -  22 (calc) 26 High    Sodium 135 - 146 mmol/L 136   Potassium 3.5 - 5.3 mmol/L 5.5 High    Chloride 98 - 110 mmol/L 105   Bicarbonate (CO2) 20 - 32 mmol/L 26   Calcium 8.6 - 10.4 mg/dL 16.1 High    Phosphorus 2.1 - 4.3 mg/dL 3.1   Albumin 3.6 - 5.1 g/dL 4.0    PTH of 096  Assessment & Plan:   1. Hyperparathyroidism (HCC) 2. Diabetes mellitus with stage 3 chronic kidney disease (HCC)  - Jennifer Anthony  is being seen at a kind request of Kirstie Peri, MD. - I have reviewed her available endocrine records and clinically evaluated the patient. - Based on these reviews, she has hypercalcemia of multifactorial etiology including  primary hyperparathyroidism documented by parathyroid sestamibi scan in 2020.  She is on Sensipar 60 mg every other day.  Her recent calcium is 10.9 mg per DL.  She is not a surgical candidate. I discussed and adjusted her Sensipar to 30 mg every day as best option of approach for her. She will be seen back in 6 months with repeat labs for PTH/calcium, thyroid function test and PTH related peptide. - She is encouraged to maintain her appointment for bone density. Regarding her type 2 diabetes, seems to be reasonably controlled at 7% A1c. Priority will be avoiding hypoglycemia.  I advised her to lower her Lantus to 14 units nightly, advised her to use her CGM continuously.  She is advised to continue Farxiga 10 mg p.o. daily at breakfast. - she acknowledges that there is a room for improvement in her food and drink choices. - Suggestion is made for her to avoid simple carbohydrates  from her  diet including Cakes, Sweet Desserts, Ice Cream, Soda (diet and regular), Sweet Tea, Candies, Chips, Cookies, Store Bought Juices, Alcohol , Artificial Sweeteners,  Coffee Creamer, and "Sugar-free" Products, Lemonade. This will help patient to have more stable blood glucose profile and potentially avoid unintended weight gain.    - she is advised to maintain close follow up with Kirstie Peri, MD for primary care needs.   -Thank you for involving me in the care of this pleasant patient.  Time spent with the patient: 45  minutes, of which >50% was spent in  counseling her about her hypercalcemia/hyperparathyroidism, type 2 diabetes complicated by CKD and the rest in obtaining information about her symptoms, reviewing her previous labs/studies ( including abstractions from other facilities),  evaluations, and treatments,  and developing a plan to confirm diagnosis and long term treatment based on the latest standards of care/guidelines; and documenting her care.  Jennifer Anthony participated in the discussions, expressed understanding, and voiced agreement with the above plans.  All questions were answered to her satisfaction. she is encouraged to contact clinic should she have any questions or concerns prior to her return visit.  Follow up plan: No follow-ups on file.   Marquis Lunch, MD [facility] [address] [place], [place] [address] Phone: [phone]  Fax: [phone]     03/02/2023, 4:22 PM  This note was partially dictated with voice recognition software. Similar sounding words can be transcribed inadequately or may not  be corrected upon review.

## 2023-04-06 DIAGNOSIS — M79671 Pain in right foot: Secondary | ICD-10-CM | POA: Diagnosis not present

## 2023-04-06 DIAGNOSIS — E1140 Type 2 diabetes mellitus with diabetic neuropathy, unspecified: Secondary | ICD-10-CM | POA: Diagnosis not present

## 2023-04-06 DIAGNOSIS — L110 Acquired keratosis follicularis: Secondary | ICD-10-CM | POA: Diagnosis not present

## 2023-04-06 DIAGNOSIS — I739 Peripheral vascular disease, unspecified: Secondary | ICD-10-CM | POA: Diagnosis not present

## 2023-04-06 DIAGNOSIS — M79672 Pain in left foot: Secondary | ICD-10-CM | POA: Diagnosis not present

## 2023-04-21 DIAGNOSIS — E1129 Type 2 diabetes mellitus with other diabetic kidney complication: Secondary | ICD-10-CM | POA: Diagnosis not present

## 2023-04-21 DIAGNOSIS — E875 Hyperkalemia: Secondary | ICD-10-CM | POA: Diagnosis not present

## 2023-04-21 DIAGNOSIS — N1832 Chronic kidney disease, stage 3b: Secondary | ICD-10-CM | POA: Diagnosis not present

## 2023-04-21 DIAGNOSIS — R809 Proteinuria, unspecified: Secondary | ICD-10-CM | POA: Diagnosis not present

## 2023-05-13 DIAGNOSIS — Z299 Encounter for prophylactic measures, unspecified: Secondary | ICD-10-CM | POA: Diagnosis not present

## 2023-05-13 DIAGNOSIS — Z23 Encounter for immunization: Secondary | ICD-10-CM | POA: Diagnosis not present

## 2023-05-13 DIAGNOSIS — E7800 Pure hypercholesterolemia, unspecified: Secondary | ICD-10-CM | POA: Diagnosis not present

## 2023-05-13 DIAGNOSIS — R5383 Other fatigue: Secondary | ICD-10-CM | POA: Diagnosis not present

## 2023-05-13 DIAGNOSIS — Z79899 Other long term (current) drug therapy: Secondary | ICD-10-CM | POA: Diagnosis not present

## 2023-05-13 DIAGNOSIS — Z0000 Encounter for general adult medical examination without abnormal findings: Secondary | ICD-10-CM | POA: Diagnosis not present

## 2023-05-17 DIAGNOSIS — R5383 Other fatigue: Secondary | ICD-10-CM | POA: Diagnosis not present

## 2023-05-17 DIAGNOSIS — E7800 Pure hypercholesterolemia, unspecified: Secondary | ICD-10-CM | POA: Diagnosis not present

## 2023-05-17 DIAGNOSIS — N2581 Secondary hyperparathyroidism of renal origin: Secondary | ICD-10-CM | POA: Diagnosis not present

## 2023-05-17 DIAGNOSIS — E559 Vitamin D deficiency, unspecified: Secondary | ICD-10-CM | POA: Diagnosis not present

## 2023-05-17 DIAGNOSIS — Z79899 Other long term (current) drug therapy: Secondary | ICD-10-CM | POA: Diagnosis not present

## 2023-05-19 DIAGNOSIS — E1165 Type 2 diabetes mellitus with hyperglycemia: Secondary | ICD-10-CM | POA: Diagnosis not present

## 2023-06-30 DIAGNOSIS — D7589 Other specified diseases of blood and blood-forming organs: Secondary | ICD-10-CM | POA: Diagnosis not present

## 2023-06-30 DIAGNOSIS — E875 Hyperkalemia: Secondary | ICD-10-CM | POA: Diagnosis not present

## 2023-06-30 DIAGNOSIS — E1129 Type 2 diabetes mellitus with other diabetic kidney complication: Secondary | ICD-10-CM | POA: Diagnosis not present

## 2023-06-30 DIAGNOSIS — N1832 Chronic kidney disease, stage 3b: Secondary | ICD-10-CM | POA: Diagnosis not present

## 2023-06-30 DIAGNOSIS — E611 Iron deficiency: Secondary | ICD-10-CM | POA: Diagnosis not present

## 2023-06-30 DIAGNOSIS — E1122 Type 2 diabetes mellitus with diabetic chronic kidney disease: Secondary | ICD-10-CM | POA: Diagnosis not present

## 2023-07-06 DIAGNOSIS — I739 Peripheral vascular disease, unspecified: Secondary | ICD-10-CM | POA: Diagnosis not present

## 2023-07-06 DIAGNOSIS — E1140 Type 2 diabetes mellitus with diabetic neuropathy, unspecified: Secondary | ICD-10-CM | POA: Diagnosis not present

## 2023-07-06 DIAGNOSIS — M79671 Pain in right foot: Secondary | ICD-10-CM | POA: Diagnosis not present

## 2023-07-06 DIAGNOSIS — M79672 Pain in left foot: Secondary | ICD-10-CM | POA: Diagnosis not present

## 2023-07-06 DIAGNOSIS — L110 Acquired keratosis follicularis: Secondary | ICD-10-CM | POA: Diagnosis not present

## 2023-07-26 DIAGNOSIS — E1129 Type 2 diabetes mellitus with other diabetic kidney complication: Secondary | ICD-10-CM | POA: Diagnosis not present

## 2023-07-26 DIAGNOSIS — R809 Proteinuria, unspecified: Secondary | ICD-10-CM | POA: Diagnosis not present

## 2023-07-26 DIAGNOSIS — N1832 Chronic kidney disease, stage 3b: Secondary | ICD-10-CM | POA: Diagnosis not present

## 2023-07-26 DIAGNOSIS — E1122 Type 2 diabetes mellitus with diabetic chronic kidney disease: Secondary | ICD-10-CM | POA: Diagnosis not present

## 2023-07-28 ENCOUNTER — Other Ambulatory Visit: Payer: Self-pay | Admitting: "Endocrinology

## 2023-08-09 DIAGNOSIS — R809 Proteinuria, unspecified: Secondary | ICD-10-CM | POA: Diagnosis not present

## 2023-08-09 DIAGNOSIS — E211 Secondary hyperparathyroidism, not elsewhere classified: Secondary | ICD-10-CM | POA: Diagnosis not present

## 2023-08-09 DIAGNOSIS — N189 Chronic kidney disease, unspecified: Secondary | ICD-10-CM | POA: Diagnosis not present

## 2023-08-09 DIAGNOSIS — D631 Anemia in chronic kidney disease: Secondary | ICD-10-CM | POA: Diagnosis not present

## 2023-08-17 ENCOUNTER — Telehealth: Payer: Self-pay | Admitting: "Endocrinology

## 2023-08-17 DIAGNOSIS — E213 Hyperparathyroidism, unspecified: Secondary | ICD-10-CM

## 2023-08-17 NOTE — Telephone Encounter (Signed)
Can labs be update labs for [facility]

## 2023-08-17 NOTE — Telephone Encounter (Signed)
Order updated

## 2023-08-18 DIAGNOSIS — I10 Essential (primary) hypertension: Secondary | ICD-10-CM | POA: Diagnosis not present

## 2023-08-18 DIAGNOSIS — E1169 Type 2 diabetes mellitus with other specified complication: Secondary | ICD-10-CM | POA: Diagnosis not present

## 2023-08-18 DIAGNOSIS — E7800 Pure hypercholesterolemia, unspecified: Secondary | ICD-10-CM | POA: Diagnosis not present

## 2023-08-18 DIAGNOSIS — Z299 Encounter for prophylactic measures, unspecified: Secondary | ICD-10-CM | POA: Diagnosis not present

## 2023-08-30 ENCOUNTER — Inpatient Hospital Stay: Payer: 59 | Admitting: Hematology

## 2023-08-30 ENCOUNTER — Inpatient Hospital Stay: Payer: 59

## 2023-08-30 NOTE — Progress Notes (Deleted)
[facility] [address] [place] [address]   Clinic Day:  08/30/2023  Referring physician: Kirstie Peri, MD  Patient Care Team: Kirstie Peri, MD as PCP - General (Internal Medicine)   ASSESSMENT & PLAN:   Assessment:  1.  IgG/IgA kappa biclonal MGUS: - Patient seen at the request of Dr. Wolfgang Phoenix. - 06/30/2023: SPEP: 0.6 g and 0.3 g, IgG kappa and IgA kappa - She has CKD stage IIIb, since 2018, secondary to diabetes. - 24-hour urine total protein was 108 mg.  Positive for Bence-Jones protein kappa type. - Hemoglobin-10.9, MCV 83, ferritin 32, percent saturation 32.  2.  Social/family history: ***  Plan:  1.  IgG/IgA kappa biclonal MGUS: ***  No orders of the defined types were placed in this encounter.     Doreatha Massed, MD   12/30/202412:40 PM  CHIEF COMPLAINT/PURPOSE OF CONSULT:   Diagnosis: Monoclonal gammopathy  Current Therapy: Under workup  HISTORY OF PRESENT ILLNESS:   Jennifer Anthony is a 82 y.o. female presenting to clinic today for evaluation of MGUS at the request of Dr. Wolfgang Phoenix.  Today, she states that she is doing well overall. Her appetite level is at ***%. Her energy level is at ***%.  She had abnormal labs from 06/30/2023 which showed 2 spikes of abnormal protein on SPEP measuring 0.6 g and 0.2 g.  PAST MEDICAL HISTORY:   Past Medical History: Past Medical History:  Diagnosis Date  . Adenocarcinoma of colon (HCC)   . CKD (chronic kidney disease), stage III (HCC)   . Diabetes mellitus   . Diabetes mellitus, type II (HCC)   . Hyperkalemia   . Hypertension   . Hypertension   . Osteoarthritis     Surgical History: Past Surgical History:  Procedure Laterality Date  . ABDOMINAL HYSTERECTOMY    . ANKLE FRACTURE SURGERY    . CATARACT EXTRACTION, BILATERAL    . COLON SURGERY      Social History: Social History   Socioeconomic History  . Marital status: Widowed    Spouse name: Not on file  . Number of  children: Not on file  . Years of education: Not on file  . Highest education level: Not on file  Occupational History  . Not on file  Tobacco Use  . Smoking status: Former    Current packs/day: 0.00    Average packs/day: 0.5 packs/day for 20.0 years (10.0 ttl pk-yrs)    Types: Cigarettes    Start date: 26    Quit date: 2000    Years since quitting: 25.0  . Smokeless tobacco: Never  Vaping Use  . Vaping status: Never Used  Substance and Sexual Activity  . Alcohol use: No  . Drug use: No  . Sexual activity: Not on file    Comment: widowed  Other Topics Concern  . Not on file  Social History Narrative  . Not on file   Social Drivers of Health   Financial Resource Strain: Low Risk  (08/10/2022)   Received from [facility], [facility]   Overall Financial Resource Strain (CARDIA)   . Difficulty of Paying Living Expenses: Not hard at all  Food Insecurity: No Food Insecurity (08/10/2022)   Received from [facility], [facility]   Hunger Vital Sign   . Worried About Programme researcher, broadcasting/film/video in the Last Year: Never true   . Ran Out of Food in the Last Year: Never true  Transportation Needs: No Transportation Needs (  08/07/2022)   Received from [facility], [facility]   [facility] - Transportation   . Lack of Transportation (Medical): No   . Lack of Transportation (Non-Medical): No  Physical Activity: Not on file  Stress: Not on file  Social Connections: Not on file  Intimate Partner Violence: Not on file    Family History: Family History  Problem Relation Age of Onset  . Stroke Mother   . Heart failure Mother   . Cancer Father     Current Medications:  Current Outpatient Medications:  .  amLODipine (NORVASC) 2.5 MG tablet, Take 5 mg by mouth daily., Disp: , Rfl:  .  aspirin 81 MG tablet, Take 81 mg by mouth daily.  , Disp: , Rfl:  .  Cholecalciferol (VITAMIN D-3) 25 MCG (1000 UT) CAPS, Take 1 capsule by mouth daily., Disp: , Rfl:  .  cinacalcet  (SENSIPAR) 30 MG tablet, TAKE 1 TABLET(30 MG) BY MOUTH DAILY WITH BREAKFAST, Disp: 90 tablet, Rfl: 1 .  FARXIGA 10 MG TABS tablet, Take 10 mg by mouth daily., Disp: , Rfl:  .  insulin lispro (HUMALOG) 100 UNIT/ML KwikPen, Inject 10 Units into the skin 3 (three) times daily before meals. , Disp: , Rfl:  .  LANTUS SOLOSTAR 100 UNIT/ML Solostar Pen, Inject 14 Units into the skin at bedtime., Disp: , Rfl:  .  pravastatin (PRAVACHOL) 10 MG tablet, Take 10 mg by mouth daily., Disp: , Rfl:  .  VELTASSA 8.4 g packet, daily., Disp: , Rfl:    Allergies: Allergies  Allergen Reactions  . Ampicillin Swelling  . Clindamycin/Lincomycin     REVIEW OF SYSTEMS:   Review of Systems  All other systems reviewed and are negative.    VITALS:   There were no vitals taken for this visit.  Wt Readings from Last 3 Encounters:  03/02/23 156 lb (70.8 kg)  01/31/19 168 lb (76.2 kg)  01/10/19 162 lb (73.5 kg)    There is no height or weight on file to calculate BMI.   PHYSICAL EXAM:   Physical Exam Vitals reviewed.  Constitutional:      Appearance: Normal appearance.  Neurological:     Mental Status: She is alert.    LABS:       No data to display             No data to display           No results found for: "CEA1", "CEA" / No results found for: "CEA1", "CEA" No results found for: "PSA1" No results found for: "KGM010" No results found for: "CAN125"  No results found for: "TOTALPROTELP", "ALBUMINELP", "A1GS", "A2GS", "BETS", "BETA2SER", "GAMS", "MSPIKE", "SPEI" No results found for: "TIBC", "FERRITIN", "IRONPCTSAT" No results found for: "LDH"   STUDIES:   No results found.

## 2023-09-08 ENCOUNTER — Telehealth: Payer: Self-pay | Admitting: "Endocrinology

## 2023-09-08 DIAGNOSIS — E213 Hyperparathyroidism, unspecified: Secondary | ICD-10-CM | POA: Diagnosis not present

## 2023-09-08 DIAGNOSIS — N183 Chronic kidney disease, stage 3 unspecified: Secondary | ICD-10-CM | POA: Diagnosis not present

## 2023-09-08 DIAGNOSIS — E1122 Type 2 diabetes mellitus with diabetic chronic kidney disease: Secondary | ICD-10-CM | POA: Diagnosis not present

## 2023-09-08 NOTE — Telephone Encounter (Signed)
 Pt needs [facility] to [facility], is going today

## 2023-09-08 NOTE — Telephone Encounter (Signed)
Order changed to [facility]

## 2023-09-13 ENCOUNTER — Ambulatory Visit: Payer: 59 | Admitting: "Endocrinology

## 2023-09-15 ENCOUNTER — Ambulatory Visit (INDEPENDENT_AMBULATORY_CARE_PROVIDER_SITE_OTHER): Payer: 59 | Admitting: "Endocrinology

## 2023-09-15 ENCOUNTER — Encounter: Payer: Self-pay | Admitting: "Endocrinology

## 2023-09-15 VITALS — BP 136/64 | HR 68 | Ht 68.0 in | Wt 154.0 lb

## 2023-09-15 DIAGNOSIS — Z794 Long term (current) use of insulin: Secondary | ICD-10-CM

## 2023-09-15 DIAGNOSIS — Z7984 Long term (current) use of oral hypoglycemic drugs: Secondary | ICD-10-CM

## 2023-09-15 DIAGNOSIS — N183 Chronic kidney disease, stage 3 unspecified: Secondary | ICD-10-CM | POA: Diagnosis not present

## 2023-09-15 DIAGNOSIS — E1122 Type 2 diabetes mellitus with diabetic chronic kidney disease: Secondary | ICD-10-CM

## 2023-09-15 DIAGNOSIS — E213 Hyperparathyroidism, unspecified: Secondary | ICD-10-CM

## 2023-09-15 NOTE — Progress Notes (Signed)
 09/15/2023, 11:37 AM  Endocrinology follow-up note   Subjective:    Patient ID: Jennifer Anthony, female    DOB: May 24, 1941, PCP Theoplis Fix, MD   Past Medical History:  Diagnosis Date   Adenocarcinoma of colon (HCC)    CKD (chronic kidney disease), stage III (HCC)    Diabetes mellitus    Diabetes mellitus, type II (HCC)    Hyperkalemia    Hypertension    Hypertension    Osteoarthritis    Past Surgical History:  Procedure Laterality Date   ABDOMINAL HYSTERECTOMY     ANKLE FRACTURE SURGERY     CATARACT EXTRACTION, BILATERAL     COLON SURGERY     Social History   Socioeconomic History   Marital status: Widowed    Spouse name: Not on file   Number of children: Not on file   Years of education: Not on file   Highest education level: Not on file  Occupational History   Not on file  Tobacco Use   Smoking status: Former    Current packs/day: 0.00    Average packs/day: 0.5 packs/day for 20.0 years (10.0 ttl pk-yrs)    Types: Cigarettes    Start date: 26    Quit date: 2000    Years since quitting: 25.0   Smokeless tobacco: Never  Vaping Use   Vaping status: Never Used  Substance and Sexual Activity   Alcohol use: No   Drug use: No   Sexual activity: Not on file    Comment: widowed  Other Topics Concern   Not on file  Social History Narrative   Not on file   Social Drivers of Health   Financial Resource Strain: Low Risk  (08/10/2022)   Received from [facility], [facility]   Overall Financial Resource Strain (CARDIA)    Difficulty of Paying Living Expenses: Not hard at all  Food Insecurity: No Food Insecurity (08/10/2022)   Received from [facility], [facility]   Hunger Vital Sign    Worried About Running Out of Food in the Last Year: Never true    Ran Out of Food in the Last Year: Never true  Transportation Needs: No Transportation Needs (08/07/2022)   Received from [facility], [facility]   [facility] - Transportation    Lack of Transportation (Medical): No    Lack of Transportation (Non-Medical): No  Physical Activity: Not on file  Stress: Not on file  Social Connections: Not on file   Family History  Problem Relation Age of Onset   Stroke Mother    Heart failure Mother    Cancer Father    Outpatient Encounter Medications as of 09/15/2023  Medication Sig   amLODipine (NORVASC) 2.5 MG tablet Take 5 mg by mouth daily.   aspirin 81 MG tablet Take 81 mg by mouth daily.     Cholecalciferol (VITAMIN D-3) 25 MCG (1000 UT) CAPS Take 1 capsule by mouth daily.   cinacalcet  (SENSIPAR ) 30 MG tablet TAKE 1 TABLET(30 MG) BY MOUTH DAILY WITH BREAKFAST   FARXIGA 10 MG TABS tablet Take 10 mg by mouth daily.   insulin lispro (HUMALOG) 100 UNIT/ML KwikPen Inject 10 Units into the  skin 3 (three) times daily before meals.    LANTUS SOLOSTAR 100 UNIT/ML Solostar Pen Inject 14 Units into the skin at bedtime.   pravastatin (PRAVACHOL) 10 MG tablet Take 10 mg by mouth daily.   VELTASSA 8.4 g packet daily.   No facility-administered encounter medications on file as of 09/15/2023.   ALLERGIES: Allergies  Allergen Reactions   Ampicillin Swelling   Clindamycin/Lincomycin     VACCINATION STATUS:  There is no immunization history on file for this patient.  HPI Jennifer Anthony is 83 y.o. female who presents today with a medical history as above. she is being seen in follow-up after she was seen in consultation for hyperparathyroidism requested by Theoplis Fix, MD.  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 She continued to have mild to moderate hyperglycemia, which necessitated initiation of Sensipar  treatment.  She is currently on Sensipar  30 mg p.o. daily.  Patient also has CKD on nephrology care.  Her most recent labs show improved calcium at 10.4, improved PTH at 238 from 278, BUN 36, creatinine  1.41 with a GFR of 37. She still did not have bone density.   She denies acute complaints today. She has type 2 diabetes on Farxiga 10 mg p.o. daily as well as Lantus 14 units nightly.  She has 5% level of 1 hypoglycemia .  Her Dexcom CGM shows 58% time range, 24% level 1 hyperglycemia, 13% level 2 hyperglycemia. Her recent A1c was reported to be 7%.  Review of Systems  Constitutional: + Mildly fluctuating body weight,  + fatigue, no subjective hyperthermia, no subjective hypothermia Eyes: no blurry vision, no xerophthalmia ENT: no sore throat, no nodules palpated in throat, no dysphagia/odynophagia, no hoarseness Cardiovascular: no Chest Pain, no Shortness of Breath, no palpitations, no leg swelling Respiratory: no cough, no shortness of breath Gastrointestinal: no Nausea/Vomiting/Diarhhea Musculoskeletal: no muscle/joint aches Skin: no rashes Neurological: no tremors, no numbness, no tingling, no dizziness Psychiatric: no depression, no anxiety  Objective:       09/15/2023   11:20 AM 03/02/2023    2:18 PM 01/31/2019   11:29 AM  Vitals with BMI  Height 5\' 8"  5\' 8"  5\' 9"   Weight 154 lbs 156 lbs 168 lbs  BMI 23.42 23.73 24.8  Systolic 140 128 981  Diastolic 68 54 75  Pulse 68 68     BP (!) 140/68   Pulse 68   Ht 5\' 8"  (1.727 m)   Wt 154 lb (69.9 kg)   BMI 23.42 kg/m   Wt Readings from Last 3 Encounters:  09/15/23 154 lb (69.9 kg)  03/02/23 156 lb (70.8 kg)  01/31/19 168 lb (76.2 kg)    Physical Exam  Constitutional:  Body mass index is 23.42 kg/m.,  not in acute distress, normal state of mind Eyes: PERRLA, EOMI, no exophthalmos ENT: moist mucous membranes, no gross thyromegaly, no gross cervical lymphadenopathy   Labs from December 24, 2022  BUN 7 - 25 mg/dL 36 High    Creatinine 1.91 - 0.95 mg/dL 4.78 High    eGFR CKD-EPI CR 2021 > OR = 60 mL/min/1.75m2 37 Low    BUN/Creatinine Ratio 6 - 22 (calc) 26 High    Sodium 135 - 146 mmol/L 136   Potassium 3.5 - 5.3  mmol/L 5.5 High    Chloride 98 - 110 mmol/L 105   Bicarbonate (CO2) 20 - 32 mmol/L 26   Calcium 8.6 - 10.4 mg/dL 29.5 High  Phosphorus 2.1 - 4.3 mg/dL 3.1   Albumin 3.6 - 5.1 g/dL 4.0    PTH of 098  Recent Results (from the past 2160 hours)  T4, Free     Status: None   Collection Time: 09/08/23  1:06 PM  Result Value Ref Range   Free T4 1.1 0.8 - 1.8 ng/dL  TSH     Status: None   Collection Time: 09/08/23  1:06 PM  Result Value Ref Range   TSH 1.19 0.40 - 4.50 mIU/L  PTH, intact and calcium     Status: Abnormal   Collection Time: 09/08/23  1:06 PM  Result Value Ref Range   PTH 238 (H) 16 - 77 pg/mL    Comment: . Interpretive Guide    Intact PTH           Calcium ------------------    ----------           ------- Normal Parathyroid     Normal               Normal Hypoparathyroidism    Low or Low Normal    Low Hyperparathyroidism    Primary            Normal or High       High    Secondary          High                 Normal or Low    Tertiary           High                 High Non-Parathyroid     Hypercalcemia      Low or Low Normal    High .    Calcium 10.4 8.6 - 10.4 mg/dL    Assessment & Plan:   1. Hyperparathyroidism (HCC) 2. Diabetes mellitus with stage 3 chronic kidney disease (HCC)   - I have reviewed her new and available endocrine records and clinically evaluated the patient. 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    She is responding to Sensipar  therapy with normalization of calcium at 10.4 associated with still high PTH on the background of CKD.   She is not a surgical candidate. I advised her to continue  Sensipar  30 mg every day as best option of approach for her. She will be seen back in 6 months with repeat labs for PTH/calcium, thyroid  function test and PTH related peptide. - She is encouraged to maintain her appointment  for bone density. Regarding her type 2 diabetes, seems to be reasonably controlled at 7% A1c. Priority will be avoiding hypoglycemia.  She is advised to continue Lantus 14 units nightly, Farxiga 10 mg p.o. daily at breakfast. advised her to use her CGM continuously.  - she acknowledges that there is a room for improvement in her food and drink choices. - Suggestion is made for her to avoid simple carbohydrates  from her diet including Cakes, Sweet Desserts, Ice Cream, Soda (diet and regular), Sweet Tea, Candies, Chips, Cookies, Store Bought Juices, Alcohol , Artificial Sweeteners,  Coffee Creamer, and "Sugar-free" Products, Lemonade. This will help patient to have more stable blood glucose profile and potentially avoid unintended weight gain.   - she is advised to maintain close follow up with Theoplis Fix, MD for primary care needs.   I spent  22  minutes in the care of the patient today including review of labs from Thyroid  Function, CMP, and other relevant labs ; imaging/biopsy records (current and previous including abstractions from other facilities); face-to-face time discussing  her lab results and symptoms, medications doses, her options of short and long term treatment based on the latest standards of care / guidelines;   and documenting the encounter.  Jennifer Anthony  participated in the discussions, expressed understanding, and voiced agreement with the above plans.  All questions were answered to her satisfaction. she is encouraged to contact clinic should she have any questions or concerns prior to her return visit.   Follow up plan: Return in about 6 months (around 03/14/2024) for F/U with Pre-visit Labs, A1c -NV.   Kalvin Orf, MD [facility] [address] [place], [place] [address] Phone: [phone]  Fax: [phone]     09/15/2023, 11:37 AM  This note was partially dictated with voice recognition software. Similar  sounding words can be transcribed inadequately or may not  be corrected upon review.

## 2023-09-16 LAB — PTH-RELATED PEPTIDE: PTH-Related Protein (PTH-RP): 13 pg/mL (ref 11–20)

## 2023-09-16 LAB — PTH, INTACT AND CALCIUM
Calcium: 10.4 mg/dL (ref 8.6–10.4)
PTH: 238 pg/mL — ABNORMAL HIGH (ref 16–77)

## 2023-09-16 LAB — T4, FREE: Free T4: 1.1 ng/dL (ref 0.8–1.8)

## 2023-09-16 LAB — TSH: TSH: 1.19 m[IU]/L (ref 0.40–4.50)

## 2023-09-23 DIAGNOSIS — E1122 Type 2 diabetes mellitus with diabetic chronic kidney disease: Secondary | ICD-10-CM | POA: Diagnosis not present

## 2023-09-23 DIAGNOSIS — E1129 Type 2 diabetes mellitus with other diabetic kidney complication: Secondary | ICD-10-CM | POA: Diagnosis not present

## 2023-09-23 DIAGNOSIS — R809 Proteinuria, unspecified: Secondary | ICD-10-CM | POA: Diagnosis not present

## 2023-09-23 DIAGNOSIS — N1832 Chronic kidney disease, stage 3b: Secondary | ICD-10-CM | POA: Diagnosis not present

## 2023-09-27 ENCOUNTER — Other Ambulatory Visit: Payer: 59

## 2023-09-27 ENCOUNTER — Inpatient Hospital Stay: Payer: 59 | Admitting: Oncology

## 2023-10-05 DIAGNOSIS — M79671 Pain in right foot: Secondary | ICD-10-CM | POA: Diagnosis not present

## 2023-10-05 DIAGNOSIS — M79672 Pain in left foot: Secondary | ICD-10-CM | POA: Diagnosis not present

## 2023-10-05 DIAGNOSIS — L110 Acquired keratosis follicularis: Secondary | ICD-10-CM | POA: Diagnosis not present

## 2023-10-05 DIAGNOSIS — I739 Peripheral vascular disease, unspecified: Secondary | ICD-10-CM | POA: Diagnosis not present

## 2023-10-05 DIAGNOSIS — E1140 Type 2 diabetes mellitus with diabetic neuropathy, unspecified: Secondary | ICD-10-CM | POA: Diagnosis not present

## 2023-11-10 DIAGNOSIS — N189 Chronic kidney disease, unspecified: Secondary | ICD-10-CM | POA: Diagnosis not present

## 2023-11-17 DIAGNOSIS — E1122 Type 2 diabetes mellitus with diabetic chronic kidney disease: Secondary | ICD-10-CM | POA: Diagnosis not present

## 2023-11-17 DIAGNOSIS — E210 Primary hyperparathyroidism: Secondary | ICD-10-CM | POA: Diagnosis not present

## 2023-11-17 DIAGNOSIS — R809 Proteinuria, unspecified: Secondary | ICD-10-CM | POA: Diagnosis not present

## 2023-11-17 DIAGNOSIS — N1832 Chronic kidney disease, stage 3b: Secondary | ICD-10-CM | POA: Diagnosis not present

## 2023-11-23 DIAGNOSIS — E1169 Type 2 diabetes mellitus with other specified complication: Secondary | ICD-10-CM | POA: Diagnosis not present

## 2023-11-23 DIAGNOSIS — N1832 Chronic kidney disease, stage 3b: Secondary | ICD-10-CM | POA: Diagnosis not present

## 2023-11-23 DIAGNOSIS — M21969 Unspecified acquired deformity of unspecified lower leg: Secondary | ICD-10-CM | POA: Diagnosis not present

## 2023-11-23 DIAGNOSIS — I10 Essential (primary) hypertension: Secondary | ICD-10-CM | POA: Diagnosis not present

## 2023-11-23 DIAGNOSIS — Z299 Encounter for prophylactic measures, unspecified: Secondary | ICD-10-CM | POA: Diagnosis not present

## 2023-11-23 DIAGNOSIS — J449 Chronic obstructive pulmonary disease, unspecified: Secondary | ICD-10-CM | POA: Diagnosis not present

## 2023-11-23 DIAGNOSIS — R52 Pain, unspecified: Secondary | ICD-10-CM | POA: Diagnosis not present

## 2023-12-24 DIAGNOSIS — I10 Essential (primary) hypertension: Secondary | ICD-10-CM | POA: Diagnosis not present

## 2023-12-24 DIAGNOSIS — Z794 Long term (current) use of insulin: Secondary | ICD-10-CM | POA: Diagnosis not present

## 2023-12-24 DIAGNOSIS — N183 Chronic kidney disease, stage 3 unspecified: Secondary | ICD-10-CM | POA: Diagnosis not present

## 2023-12-24 DIAGNOSIS — Z299 Encounter for prophylactic measures, unspecified: Secondary | ICD-10-CM | POA: Diagnosis not present

## 2023-12-24 DIAGNOSIS — E1169 Type 2 diabetes mellitus with other specified complication: Secondary | ICD-10-CM | POA: Diagnosis not present

## 2023-12-27 DIAGNOSIS — R42 Dizziness and giddiness: Secondary | ICD-10-CM | POA: Diagnosis not present

## 2024-01-04 DIAGNOSIS — M79672 Pain in left foot: Secondary | ICD-10-CM | POA: Diagnosis not present

## 2024-01-04 DIAGNOSIS — L110 Acquired keratosis follicularis: Secondary | ICD-10-CM | POA: Diagnosis not present

## 2024-01-04 DIAGNOSIS — I739 Peripheral vascular disease, unspecified: Secondary | ICD-10-CM | POA: Diagnosis not present

## 2024-01-04 DIAGNOSIS — M79671 Pain in right foot: Secondary | ICD-10-CM | POA: Diagnosis not present

## 2024-01-04 DIAGNOSIS — E1140 Type 2 diabetes mellitus with diabetic neuropathy, unspecified: Secondary | ICD-10-CM | POA: Diagnosis not present

## 2024-01-11 ENCOUNTER — Other Ambulatory Visit: Payer: Self-pay | Admitting: "Endocrinology

## 2024-01-25 DIAGNOSIS — J449 Chronic obstructive pulmonary disease, unspecified: Secondary | ICD-10-CM | POA: Diagnosis not present

## 2024-01-25 DIAGNOSIS — I10 Essential (primary) hypertension: Secondary | ICD-10-CM | POA: Diagnosis not present

## 2024-01-25 DIAGNOSIS — Z299 Encounter for prophylactic measures, unspecified: Secondary | ICD-10-CM | POA: Diagnosis not present

## 2024-01-25 DIAGNOSIS — Z794 Long term (current) use of insulin: Secondary | ICD-10-CM | POA: Diagnosis not present

## 2024-01-25 DIAGNOSIS — N183 Chronic kidney disease, stage 3 unspecified: Secondary | ICD-10-CM | POA: Diagnosis not present

## 2024-01-25 DIAGNOSIS — E1122 Type 2 diabetes mellitus with diabetic chronic kidney disease: Secondary | ICD-10-CM | POA: Diagnosis not present

## 2024-02-03 DIAGNOSIS — E211 Secondary hyperparathyroidism, not elsewhere classified: Secondary | ICD-10-CM | POA: Diagnosis not present

## 2024-02-03 DIAGNOSIS — R809 Proteinuria, unspecified: Secondary | ICD-10-CM | POA: Diagnosis not present

## 2024-02-03 DIAGNOSIS — D631 Anemia in chronic kidney disease: Secondary | ICD-10-CM | POA: Diagnosis not present

## 2024-02-03 DIAGNOSIS — N189 Chronic kidney disease, unspecified: Secondary | ICD-10-CM | POA: Diagnosis not present

## 2024-02-08 DIAGNOSIS — E1129 Type 2 diabetes mellitus with other diabetic kidney complication: Secondary | ICD-10-CM | POA: Diagnosis not present

## 2024-02-08 DIAGNOSIS — E875 Hyperkalemia: Secondary | ICD-10-CM | POA: Diagnosis not present

## 2024-02-08 DIAGNOSIS — R809 Proteinuria, unspecified: Secondary | ICD-10-CM | POA: Diagnosis not present

## 2024-02-08 DIAGNOSIS — N1832 Chronic kidney disease, stage 3b: Secondary | ICD-10-CM | POA: Diagnosis not present

## 2024-02-19 DIAGNOSIS — E1165 Type 2 diabetes mellitus with hyperglycemia: Secondary | ICD-10-CM | POA: Diagnosis not present

## 2024-02-24 DIAGNOSIS — Z299 Encounter for prophylactic measures, unspecified: Secondary | ICD-10-CM | POA: Diagnosis not present

## 2024-02-24 DIAGNOSIS — J449 Chronic obstructive pulmonary disease, unspecified: Secondary | ICD-10-CM | POA: Diagnosis not present

## 2024-02-24 DIAGNOSIS — I10 Essential (primary) hypertension: Secondary | ICD-10-CM | POA: Diagnosis not present

## 2024-02-24 DIAGNOSIS — E1165 Type 2 diabetes mellitus with hyperglycemia: Secondary | ICD-10-CM | POA: Diagnosis not present

## 2024-03-08 ENCOUNTER — Encounter: Payer: Self-pay | Admitting: Hematology

## 2024-03-08 ENCOUNTER — Inpatient Hospital Stay: Attending: Hematology | Admitting: Hematology

## 2024-03-08 ENCOUNTER — Inpatient Hospital Stay

## 2024-03-08 ENCOUNTER — Ambulatory Visit (HOSPITAL_COMMUNITY)
Admission: RE | Admit: 2024-03-08 | Discharge: 2024-03-08 | Disposition: A | Discharge status: Home / Self Care | Source: Ambulatory Visit | Attending: Hematology | Admitting: Hematology

## 2024-03-08 VITALS — BP 120/51 | HR 65 | Temp 97.9°F | Resp 17 | Ht 66.0 in | Wt 148.0 lb

## 2024-03-08 DIAGNOSIS — D649 Anemia, unspecified: Secondary | ICD-10-CM | POA: Diagnosis not present

## 2024-03-08 DIAGNOSIS — Z87891 Personal history of nicotine dependence: Secondary | ICD-10-CM | POA: Insufficient documentation

## 2024-03-08 DIAGNOSIS — D509 Iron deficiency anemia, unspecified: Secondary | ICD-10-CM | POA: Insufficient documentation

## 2024-03-08 DIAGNOSIS — R7989 Other specified abnormal findings of blood chemistry: Secondary | ICD-10-CM | POA: Insufficient documentation

## 2024-03-08 DIAGNOSIS — Z800 Family history of malignant neoplasm of digestive organs: Secondary | ICD-10-CM | POA: Diagnosis not present

## 2024-03-08 DIAGNOSIS — Z803 Family history of malignant neoplasm of breast: Secondary | ICD-10-CM | POA: Diagnosis not present

## 2024-03-08 DIAGNOSIS — M47819 Spondylosis without myelopathy or radiculopathy, site unspecified: Secondary | ICD-10-CM | POA: Diagnosis not present

## 2024-03-08 DIAGNOSIS — D472 Monoclonal gammopathy: Secondary | ICD-10-CM | POA: Insufficient documentation

## 2024-03-08 DIAGNOSIS — D631 Anemia in chronic kidney disease: Secondary | ICD-10-CM | POA: Insufficient documentation

## 2024-03-08 DIAGNOSIS — N183 Chronic kidney disease, stage 3 unspecified: Secondary | ICD-10-CM | POA: Diagnosis not present

## 2024-03-08 LAB — RETICULOCYTES
Immature Retic Fract: 8.3 % (ref 2.3–15.9)
RBC.: 3.68 MIL/uL — ABNORMAL LOW (ref 3.87–5.11)
Retic Count, Absolute: 44.2 K/uL (ref 19.0–186.0)
Retic Ct Pct: 1.2 % (ref 0.4–3.1)

## 2024-03-08 LAB — CBC WITH DIFFERENTIAL/PLATELET
Abs Immature Granulocytes: 0.01 K/uL (ref 0.00–0.07)
Basophils Absolute: 0 K/uL (ref 0.0–0.1)
Basophils Relative: 0 %
Eosinophils Absolute: 0.1 K/uL (ref 0.0–0.5)
Eosinophils Relative: 2 %
HCT: 30.9 % — ABNORMAL LOW (ref 36.0–46.0)
Hemoglobin: 9.7 g/dL — ABNORMAL LOW (ref 12.0–15.0)
Immature Granulocytes: 0 %
Lymphocytes Relative: 23 %
Lymphs Abs: 1.3 K/uL (ref 0.7–4.0)
MCH: 27.2 pg (ref 26.0–34.0)
MCHC: 31.4 g/dL (ref 30.0–36.0)
MCV: 86.6 fL (ref 80.0–100.0)
Monocytes Absolute: 0.4 K/uL (ref 0.1–1.0)
Monocytes Relative: 7 %
Neutro Abs: 3.7 K/uL (ref 1.7–7.7)
Neutrophils Relative %: 68 %
Platelets: 218 K/uL (ref 150–400)
RBC: 3.57 MIL/uL — ABNORMAL LOW (ref 3.87–5.11)
RDW: 13.9 % (ref 11.5–15.5)
WBC: 5.4 K/uL (ref 4.0–10.5)
nRBC: 0 % (ref 0.0–0.2)

## 2024-03-08 LAB — DIRECT ANTIGLOBULIN TEST (NOT AT ARMC)
DAT, IgG: NEGATIVE
DAT, complement: NEGATIVE

## 2024-03-08 LAB — IRON AND TIBC
Iron: 70 ug/dL (ref 28–170)
Saturation Ratios: 30 % (ref 10.4–31.8)
TIBC: 232 ug/dL — ABNORMAL LOW (ref 250–450)
UIBC: 162 ug/dL

## 2024-03-08 LAB — LACTATE DEHYDROGENASE: LDH: 111 U/L (ref 98–192)

## 2024-03-08 LAB — FERRITIN: Ferritin: 45 ng/mL (ref 11–307)

## 2024-03-08 LAB — FOLATE: Folate: 13 ng/mL (ref >5.9)

## 2024-03-08 LAB — VITAMIN B12: Vitamin B-12: 698 pg/mL (ref 180–914)

## 2024-03-08 NOTE — Progress Notes (Signed)
 [name] [address], [place] [address]   Clinic Day:  03/08/2024  Referring physician: [name], MD  Patient Care Team: [name], MD as PCP - General (Internal Medicine)   ASSESSMENT & PLAN:   Assessment:  1.  Normocytic anemia: - Patient seen at the request of Dr. Rachele - 02/03/2024: Creatinine-1.37, Hb-9.6, MCV-86, WBC-5, PLT-223.  Calcium is 10.6, albumin 3.7.  Intact PTH elevated at 162. - Denies BRBPR/melena.  Jennifer Anthony is not on oral iron therapy.  No prior history of transfusion.  No ice pica. - 6 pound weight loss since January 2025 due to decreased appetite.  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 Juanito office.  Per his note, Jennifer Anthony has refused referral to us  twice previously.  3.  Social/family history: - Jennifer Anthony lives by herself and is independent of ADLs and ADLs.  Jennifer Anthony uses a cane for ambulation.  Jennifer Anthony quit smoking more than 30 years ago. - Father had colon cancer.  Maternal aunt had breast cancer.  Plan:  1.  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 - Will check for nutritional deficiencies, hemolysis, and bone marrow infiltrative process. - Will discuss the results and further plan in 10 days.  2.  Monoclonal gammopathy: - Per Dr. Juanito note, Jennifer Anthony has monoclonal gammopathy with the previous positive serum immunofixation. - Jennifer Anthony talked about the spectrum of plasma cell disorders.  Jennifer Anthony have recommended further workup with SPEP, serum free light chains, immunofixation, LDH, beta-2 microglobulin and skeletal survey. - Will discuss results in 10 days.   Orders Placed This Encounter  Procedures   DG Bone Survey Met    Standing Status:   Future    Number of Occurrences:   1    Expected Date:   03/08/2024    Expiration Date:   03/08/2025    Reason for Exam (SYMPTOM  OR DIAGNOSIS REQUIRED):   MGUS    Preferred imaging location?:   [facility]    Iron and TIBC ([facility] ONLY)    Standing Status:   Future    Number of Occurrences:   1    Expected Date:   03/08/2024    Expiration Date:   06/06/2024   Ferritin    Standing Status:   Future    Number of Occurrences:   1    Expected Date:   03/08/2024    Expiration Date:   06/06/2024   Vitamin B12    Standing Status:   Future    Number of Occurrences:   1    Expected Date:   03/08/2024    Expiration Date:   06/06/2024   Folate    Standing Status:   Future    Number of Occurrences:   1    Expected Date:   03/08/2024    Expiration Date:   06/06/2024   Copper , serum    Standing Status:   Future    Number of Occurrences:   1    Expected Date:   03/08/2024    Expiration Date:   06/06/2024   Kappa/lambda light chains    Standing Status:   Future    Number of Occurrences:   1    Expected Date:   03/08/2024    Expiration Date:   06/06/2024   Immunofixation electrophoresis    Standing Status:   Future    Number of Occurrences:   1  Expected Date:   03/08/2024    Expiration Date:   06/06/2024   Protein electrophoresis, serum    Standing Status:   Future    Number of Occurrences:   1    Expected Date:   03/08/2024    Expiration Date:   06/06/2024   Reticulocytes    Standing Status:   Future    Number of Occurrences:   1    Expected Date:   03/08/2024    Expiration Date:   06/06/2024   Lactate dehydrogenase    Standing Status:   Future    Number of Occurrences:   1    Expected Date:   03/08/2024    Expiration Date:   06/06/2024   CBC with Differential    Standing Status:   Future    Number of Occurrences:   1    Expected Date:   03/08/2024    Expiration Date:   06/06/2024   Methylmalonic acid, serum    Standing Status:   Future    Number of Occurrences:   1    Expected Date:   03/08/2024    Expiration Date:   06/06/2024   Beta 2 microglobuline, serum    Standing Status:   Future    Number of Occurrences:   1    Expected Date:   03/08/2024    Expiration Date:   03/08/2025   Direct  antiglobulin test    Standing Status:   Future    Number of Occurrences:   1    Expected Date:   03/08/2024    Expiration Date:   06/06/2024      LILLETTE Hummingbird R Teague,acting as a scribe for Alean Stands, MD.,have documented all relevant documentation on the behalf of Alean Stands, MD,as directed by  Alean Stands, MD while in the presence of Alean Stands, MD.   Jennifer Anthony, Alean Stands MD, have reviewed the above documentation for accuracy and completeness, and Jennifer Anthony agree with the above.   Alean Stands, MD   7/9/20253:46 PM  CHIEF COMPLAINT/PURPOSE OF CONSULT:   Diagnosis: Normocytic anemia and MGUS  Current Therapy: Under workup  HISTORY OF PRESENT ILLNESS:   Jennifer Anthony is a 83 y.o. female presenting to clinic today for evaluation of anemia at the request of Rachele Amour, MD.  Patient has a medical history of hypertension, DM type II, CKD stage 3, hyperparathyroidism, MGUS, and adenocarcinoma of colon.   Jennifer Anthony was seen by Dr. Rachele on 02/08/24 for regular follow-up with labs. Serum IFE results showed MGUS, but Jennifer Anthony does not wish to be seen for this per Dr. Juanito note. Renal panel from 02/03/24 showed elevated PTH at 162, elevated calcium at 10.6, elevated creatinine at 1.37, low eGFR at 38, and elevated BUN at 29. CBC from 02/03/24 found low RBC t 3.64, low HGB at 9.6, low HCT at 31.4, normal MCV at 86.3, low MCH at 26.4, and low MCHC 30.6.   Today, Jennifer Anthony states that Jennifer Anthony is doing well overall. Jennifer Anthony appetite level is at 50%. Jennifer Anthony energy level is at 25%.   PAST MEDICAL HISTORY:   Past Medical History: Past Medical History:  Diagnosis Date   Adenocarcinoma of colon (HCC)    CKD (chronic kidney disease), stage III (HCC)    Diabetes mellitus    Diabetes mellitus, type II (HCC)    Hyperkalemia    Hypertension    Hypertension    Osteoarthritis     Surgical History: Past Surgical History:  Procedure Laterality Date  ABDOMINAL HYSTERECTOMY      ANKLE FRACTURE SURGERY     CATARACT EXTRACTION, BILATERAL     COLON SURGERY      Social History: Social History   Socioeconomic History   Marital status: Widowed    Spouse name: Not on file   Number of children: Not on file   Years of education: Not on file   Highest education level: Not on file  Occupational History   Not on file  Tobacco Use   Smoking status: Former    Current packs/day: 0.00    Average packs/day: 0.5 packs/day for 20.0 years (10.0 ttl pk-yrs)    Types: Cigarettes    Start date: 35    Quit date: 2000    Years since quitting: 25.5   Smokeless tobacco: Never  Vaping Use   Vaping status: Never Used  Substance and Sexual Activity   Alcohol use: No   Drug use: No   Sexual activity: Not on file    Comment: widowed  Other Topics Concern   Not on file  Social History Narrative   Not on file   Social Drivers of Health   Financial Resource Strain: Low Risk  (08/10/2022)   Received from [facility]   Overall Financial Resource Strain (CARDIA)    Difficulty of Paying Living Expenses: Not hard at all  Food Insecurity: Food Insecurity Present (03/08/2024)   Hunger Vital Sign    Worried About Running Out of Food in the Last Year: Sometimes true    Ran Out of Food in the Last Year: Sometimes true  Transportation Needs: No Transportation Needs (03/08/2024)   PRAPARE - Administrator, Civil Service (Medical): No    Lack of Transportation (Non-Medical): No  Physical Activity: Not on file  Stress: Not on file  Social Connections: Not on file  Intimate Partner Violence: Not At Risk (03/08/2024)   Humiliation, Afraid, Rape, and Kick questionnaire    Fear of Current or Ex-Partner: No    Emotionally Abused: No    Physically Abused: No    Sexually Abused: No    Family History: Family History  Problem Relation Age of Onset   Stroke Mother    Heart failure Mother    Cancer Father     Current Medications:  Current Outpatient Medications:     amLODipine (NORVASC) 2.5 MG tablet, Take 5 mg by mouth daily., Disp: , Rfl:    aspirin 81 MG tablet, Take 81 mg by mouth daily.  , Disp: , Rfl:    Cholecalciferol (VITAMIN D-3) 25 MCG (1000 UT) CAPS, Take 1 capsule by mouth daily., Disp: , Rfl:    cinacalcet  (SENSIPAR ) 30 MG tablet, TAKE 1 TABLET(30 MG) BY MOUTH DAILY WITH BREAKFAST, Disp: 90 tablet, Rfl: 1   Continuous Glucose Sensor (DEXCOM G7 SENSOR) MISC, SMARTSIG:1 Every 10 Days, Disp: , Rfl:    FARXIGA 10 MG TABS tablet, Take 10 mg by mouth daily., Disp: , Rfl:    insulin detemir (LEVEMIR) 100 UNIT/ML injection, Inject into the skin., Disp: , Rfl:    insulin lispro (HUMALOG) 100 UNIT/ML KwikPen, Inject 10 Units into the skin 3 (three) times daily before meals. , Disp: , Rfl:    LANTUS SOLOSTAR 100 UNIT/ML Solostar Pen, Inject 14 Units into the skin at bedtime., Disp: , Rfl:    olmesartan (BENICAR) 5 MG tablet, Take 5 mg by mouth daily., Disp: , Rfl:    ONETOUCH ULTRA test strip, 1 each daily., Disp: , Rfl:  pravastatin (PRAVACHOL) 10 MG tablet, Take 10 mg by mouth daily., Disp: , Rfl:    VELTASSA 8.4 g packet, daily., Disp: , Rfl:    Allergies: Allergies  Allergen Reactions   Ampicillin Swelling   Clindamycin/Lincomycin     REVIEW OF SYSTEMS:   Review of Systems  Constitutional:  Negative for chills, fatigue and fever.  HENT:   Negative for lump/mass, mouth sores, nosebleeds, sore throat and trouble swallowing.   Eyes:  Negative for eye problems.  Respiratory:  Negative for cough and shortness of breath.   Cardiovascular:  Negative for chest pain, leg swelling and palpitations.  Gastrointestinal:  Negative for abdominal pain, constipation, diarrhea, nausea and vomiting.  Genitourinary:  Negative for bladder incontinence, difficulty urinating, dysuria, frequency, hematuria and nocturia.   Musculoskeletal:  Negative for arthralgias, back pain, flank pain, myalgias and neck pain.  Skin:  Negative for itching and rash.   Neurological:  Negative for dizziness, headaches and numbness.  Hematological:  Does not bruise/bleed easily.  Psychiatric/Behavioral:  Positive for sleep disturbance. Negative for depression and suicidal ideas. The patient is not nervous/anxious.   All other systems reviewed and are negative.    VITALS:   Blood pressure (!) 120/51, pulse 65, temperature 97.9 F (36.6 C), temperature source Tympanic, resp. rate 17, height 5' 6 (1.676 m), weight 148 lb (67.1 kg), SpO2 100%.  Wt Readings from Last 3 Encounters:  03/08/24 148 lb (67.1 kg)  09/15/23 154 lb (69.9 kg)  03/02/23 156 lb (70.8 kg)    Body mass index is 23.89 kg/m.   PHYSICAL EXAM:   Physical Exam Vitals and nursing note reviewed. Exam conducted with a chaperone present.  Constitutional:      Appearance: Normal appearance.  Cardiovascular:     Rate and Rhythm: Normal rate and regular rhythm.     Pulses: Normal pulses.     Heart sounds: Normal heart sounds.  Pulmonary:     Effort: Pulmonary effort is normal.     Breath sounds: Normal breath sounds.  Abdominal:     Palpations: Abdomen is soft. There is no hepatomegaly, splenomegaly or mass.     Tenderness: There is no abdominal tenderness.  Musculoskeletal:     Right lower leg: No edema.     Left lower leg: Edema (From previous fracture) present.  Lymphadenopathy:     Cervical: No cervical adenopathy.     Right cervical: No superficial, deep or posterior cervical adenopathy.    Left cervical: No superficial, deep or posterior cervical adenopathy.     Upper Body:     Right upper body: No supraclavicular or axillary adenopathy.     Left upper body: No supraclavicular or axillary adenopathy.  Neurological:     General: No focal deficit present.     Mental Status: Jennifer Anthony is alert and oriented to person, place, and time.  Psychiatric:        Mood and Affect: Mood normal.        Behavior: Behavior normal.     LABS:   CBC    Component Value Date/Time   WBC 5.4  03/08/2024 1409   RBC 3.68 (L) 03/08/2024 1410   RBC 3.57 (L) 03/08/2024 1409   HGB 9.7 (L) 03/08/2024 1409   HCT 30.9 (L) 03/08/2024 1409   PLT 218 03/08/2024 1409   MCV 86.6 03/08/2024 1409   MCH 27.2 03/08/2024 1409   MCHC 31.4 03/08/2024 1409   RDW 13.9 03/08/2024 1409   LYMPHSABS 1.3 03/08/2024 1409  MONOABS 0.4 03/08/2024 1409   EOSABS 0.1 03/08/2024 1409   BASOSABS 0.0 03/08/2024 1409    CMP    Component Value Date/Time   CALCIUM 10.4 09/08/2023 1306    No results found for: CEA1, CEA / No results found for: CEA1, CEA No results found for: PSA1 No results found for: CAN199 No results found for: CAN125  No results found for: TOTALPROTELP, ALBUMINELP, A1GS, A2GS, BETS, BETA2SER, GAMS, MSPIKE, SPEI No results found for: TIBC, FERRITIN, IRONPCTSAT Lab Results  Component Value Date   LDH 111 03/08/2024     STUDIES:   No results found.

## 2024-03-08 NOTE — Patient Instructions (Signed)
You were seen and examined today by Dr. Ellin Saba. Dr. Ellin Saba is a hematologist, meaning that he specializes in blood abnormalities. Dr. Ellin Saba discussed your past medical history, family history of cancers/blood conditions and the events that led to you being here today.  You were referred to Dr. Ellin Saba due to anemia.  Dr. Ellin Saba has recommended additional labs today for further evaluation.  Follow-up as scheduled.

## 2024-03-09 LAB — KAPPA/LAMBDA LIGHT CHAINS
Kappa free light chain: 101 mg/L — ABNORMAL HIGH (ref 3.3–19.4)
Kappa, lambda light chain ratio: 3.07 — ABNORMAL HIGH (ref 0.26–1.65)
Lambda free light chains: 32.9 mg/L — ABNORMAL HIGH (ref 5.7–26.3)

## 2024-03-09 LAB — BETA 2 MICROGLOBULIN, SERUM: Beta-2 Microglobulin: 4.1 mg/L — ABNORMAL HIGH (ref 0.6–2.4)

## 2024-03-09 LAB — COPPER, SERUM: Copper: 95 ug/dL (ref 80–158)

## 2024-03-10 LAB — PROTEIN ELECTROPHORESIS, SERUM
A/G Ratio: 1 (ref 0.7–1.7)
Albumin ELP: 3.3 g/dL (ref 2.9–4.4)
Alpha-1-Globulin: 0.2 g/dL (ref 0.0–0.4)
Alpha-2-Globulin: 0.6 g/dL (ref 0.4–1.0)
Beta Globulin: 0.9 g/dL (ref 0.7–1.3)
Gamma Globulin: 1.5 g/dL (ref 0.4–1.8)
Globulin, Total: 3.2 g/dL (ref 2.2–3.9)
M-Spike, %: 0.4 g/dL — ABNORMAL HIGH
Total Protein ELP: 6.5 g/dL (ref 6.0–8.5)

## 2024-03-11 LAB — METHYLMALONIC ACID, SERUM: Methylmalonic Acid, Quantitative: 160 nmol/L (ref 0–378)

## 2024-03-13 LAB — IMMUNOFIXATION ELECTROPHORESIS
IgA: 680 mg/dL — ABNORMAL HIGH (ref 64–422)
IgG (Immunoglobin G), Serum: 1190 mg/dL (ref 586–1602)
IgM (Immunoglobulin M), Srm: 59 mg/dL (ref 26–217)
Total Protein ELP: 6.5 g/dL (ref 6.0–8.5)

## 2024-03-14 ENCOUNTER — Ambulatory Visit: Payer: 59 | Admitting: "Endocrinology

## 2024-03-20 ENCOUNTER — Inpatient Hospital Stay (HOSPITAL_BASED_OUTPATIENT_CLINIC_OR_DEPARTMENT_OTHER): Admitting: Hematology

## 2024-03-20 VITALS — BP 109/50 | HR 64 | Temp 96.7°F | Resp 20 | Wt 144.8 lb

## 2024-03-20 DIAGNOSIS — D509 Iron deficiency anemia, unspecified: Secondary | ICD-10-CM | POA: Diagnosis not present

## 2024-03-20 DIAGNOSIS — D508 Other iron deficiency anemias: Secondary | ICD-10-CM

## 2024-03-20 DIAGNOSIS — D631 Anemia in chronic kidney disease: Secondary | ICD-10-CM | POA: Diagnosis not present

## 2024-03-20 DIAGNOSIS — D472 Monoclonal gammopathy: Secondary | ICD-10-CM | POA: Diagnosis not present

## 2024-03-20 DIAGNOSIS — Z800 Family history of malignant neoplasm of digestive organs: Secondary | ICD-10-CM | POA: Diagnosis not present

## 2024-03-20 DIAGNOSIS — N183 Chronic kidney disease, stage 3 unspecified: Secondary | ICD-10-CM | POA: Diagnosis not present

## 2024-03-20 DIAGNOSIS — R7989 Other specified abnormal findings of blood chemistry: Secondary | ICD-10-CM | POA: Diagnosis not present

## 2024-03-20 DIAGNOSIS — Z87891 Personal history of nicotine dependence: Secondary | ICD-10-CM | POA: Diagnosis not present

## 2024-03-20 DIAGNOSIS — Z803 Family history of malignant neoplasm of breast: Secondary | ICD-10-CM | POA: Diagnosis not present

## 2024-03-20 NOTE — Progress Notes (Signed)
 [facility] [address] [place] [address]   Clinic Day:  03/20/2024  Referring physician: [name], MD  Patient Care Team: [name], MD as PCP - General (Internal Medicine)   ASSESSMENT & PLAN:   Assessment:  1.  Normocytic anemia: - Patient seen at the request of Dr. Rachele - 02/03/2024: Creatinine-1.37, Hb-9.6, MCV-86, WBC-5, PLT-223.  Calcium is 10.6, albumin 3.7.  Intact PTH elevated at 162. - Denies BRBPR/melena.  She is not on oral iron therapy.  No prior history of transfusion.  No ice pica. - 6 pound weight loss since January 2025 due to decreased appetite.  2.  Monoclonal gammopathy: - Myeloma labs (03/08/2024): M spike 0.4 g.  Immunofixation shows IgA kappa and IgG kappa monoclonal protein.  Kappa light chains are elevated at 101, lambda light chains 32.9 and ratio of 3.09. - Skeletal survey (03/08/2024): No evidence of lytic or destructive bone lesions.  3.  Social/family history: - She lives by herself and is independent of ADLs and ADLs.  She uses a cane for ambulation.  She quit smoking more than 30 years ago. - Father had colon cancer.  Maternal aunt had breast cancer.  Plan:  1.  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 - Reviewed labs from 03/08/2024: Ferritin low at 45 and saturation 30.  B12, copper , folic acid  were normal.  Hemoglobin was 9.7. - Recommend parenteral iron therapy with INFeD 1 g IV x 1.  We discussed a chance of anaphylactic reaction.  We will give premedications. - RTC 3 months with repeat CBC, ferritin and iron panel.  2.  IgA kappa/IgG kappa monoclonal gammopathy: - We have reviewed myeloma labs from 03/08/2024: M spike is 0.4 g.  FLC ratio is 3.07.  Immunofixation shows biclonal MGUS. - We reviewed skeletal survey from 03/08/2024: No lytic lesions. - We discussed pathophysiology and prognosis of MGUS.  No crab features at this time to warrant any further treatment.  Will continue  monitoring myeloma labs every 6 months and skeletal survey once a year.   Orders Placed This Encounter  Procedures   CBC with Differential    Standing Status:   Future    Expected Date:   09/18/2024    Expiration Date:   12/17/2024   Comprehensive metabolic panel    Standing Status:   Future    Expected Date:   09/18/2024    Expiration Date:   12/17/2024   Lactate dehydrogenase    Standing Status:   Future    Expected Date:   09/18/2024    Expiration Date:   12/17/2024   Kappa/lambda light chains    Standing Status:   Future    Expected Date:   09/18/2024    Expiration Date:   12/17/2024   Protein electrophoresis, serum    Standing Status:   Future    Expected Date:   09/18/2024    Expiration Date:   12/17/2024   Iron and TIBC ([facility] ONLY)    Standing Status:   Future    Expected Date:   09/18/2024    Expiration Date:   12/17/2024   Ferritin    Standing Status:   Future    Expected Date:   09/18/2024    Expiration Date:   12/17/2024   CBC    Standing Status:   Future    Expected Date:   06/12/2024    Expiration Date:   09/10/2024   Iron and TIBC (  [facility] ONLY)    Standing Status:   Future    Expected Date:   06/12/2024    Expiration Date:   09/10/2024   Ferritin    Standing Status:   Future    Expected Date:   06/12/2024    Expiration Date:   09/10/2024      LILLETTE Hummingbird R Teague,acting as a scribe for Alean Stands, MD.,have documented all relevant documentation on the behalf of Alean Stands, MD,as directed by  Alean Stands, MD while in the presence of Alean Stands, MD.  I, Alean Stands MD, have reviewed the above documentation for accuracy and completeness, and I agree with the above.    Alean Stands, MD   7/21/20253:33 PM  CHIEF COMPLAINT/PURPOSE OF CONSULT:   Diagnosis: Normocytic anemia and MGUS  Current Therapy: Under workup  HISTORY OF PRESENT ILLNESS:   Jennifer Anthony is a 83 y.o. female  presenting to clinic today for evaluation of anemia at the request of Rachele Amour, MD.  Patient has a medical history of hypertension, DM type II, CKD stage 3, hyperparathyroidism, MGUS, and adenocarcinoma of colon.   Zamora was seen by Dr. Rachele on 02/08/24 for regular follow-up with labs. Serum IFE results showed MGUS, but she does not wish to be seen for this per Dr. Juanito note. Renal panel from 02/03/24 showed elevated PTH at 162, elevated calcium at 10.6, elevated creatinine at 1.37, low eGFR at 38, and elevated BUN at 29. CBC from 02/03/24 found low RBC t 3.64, low HGB at 9.6, low HCT at 31.4, normal MCV at 86.3, low MCH at 26.4, and low MCHC 30.6.   Today, she states that she is doing well overall. Her appetite level is at 50%. Her energy level is at 25%.   INTERVAL HISTORY:   Jennifer Anthony is a 83 y.o. female presenting to the clinic today for follow-up of MGUS and normocytic anemia. She was last seen by me on 03/08/2024.  Since her last visit, she underwent bone survey on 03/08/2024 that found: No evidence of lytic or destructive bone lesion. Remote healed distal right radius fracture. Remote right fibular fracture. Diffuse degenerative change throughout the spine.  Today, she states that she is doing well overall. Her appetite level is at 50%. Her energy level is at 25%.   PAST MEDICAL HISTORY:   Past Medical History: Past Medical History:  Diagnosis Date   Adenocarcinoma of colon (HCC)    CKD (chronic kidney disease), stage III (HCC)    Diabetes mellitus    Diabetes mellitus, type II (HCC)    Hyperkalemia    Hypertension    Hypertension    Osteoarthritis     Surgical History: Past Surgical History:  Procedure Laterality Date   ABDOMINAL HYSTERECTOMY     ANKLE FRACTURE SURGERY     CATARACT EXTRACTION, BILATERAL     COLON SURGERY      Social History: Social History   Socioeconomic History   Marital status: Widowed    Spouse name: Not on file   Number  of children: Not on file   Years of education: Not on file   Highest education level: Not on file  Occupational History   Not on file  Tobacco Use   Smoking status: Former    Current packs/day: 0.00    Average packs/day: 0.5 packs/day for 20.0 years (10.0 ttl pk-yrs)    Types: Cigarettes    Start date: 58    Quit date: 2000    Years since  quitting: 25.5   Smokeless tobacco: Never  Vaping Use   Vaping status: Never Used  Substance and Sexual Activity   Alcohol use: No   Drug use: No   Sexual activity: Not on file    Comment: widowed  Other Topics Concern   Not on file  Social History Narrative   Not on file   Social Drivers of Health   Financial Resource Strain: Low Risk  (08/10/2022)   Received from [facility]   Overall Financial Resource Strain (CARDIA)    Difficulty of Paying Living Expenses: Not hard at all  Food Insecurity: Food Insecurity Present (03/08/2024)   Hunger Vital Sign    Worried About Running Out of Food in the Last Year: Sometimes true    Ran Out of Food in the Last Year: Sometimes true  Transportation Needs: No Transportation Needs (03/08/2024)   PRAPARE - Administrator, Civil Service (Medical): No    Lack of Transportation (Non-Medical): No  Physical Activity: Not on file  Stress: Not on file  Social Connections: Not on file  Intimate Partner Violence: Not At Risk (03/08/2024)   Humiliation, Afraid, Rape, and Kick questionnaire    Fear of Current or Ex-Partner: No    Emotionally Abused: No    Physically Abused: No    Sexually Abused: No    Family History: Family History  Problem Relation Age of Onset   Stroke Mother    Heart failure Mother    Cancer Father     Current Medications:  Current Outpatient Medications:    amLODipine (NORVASC) 2.5 MG tablet, Take 5 mg by mouth daily., Disp: , Rfl:    aspirin 81 MG tablet, Take 81 mg by mouth daily.  , Disp: , Rfl:    Cholecalciferol (VITAMIN D-3) 25 MCG (1000 UT) CAPS, Take 1  capsule by mouth daily., Disp: , Rfl:    cinacalcet  (SENSIPAR ) 30 MG tablet, TAKE 1 TABLET(30 MG) BY MOUTH DAILY WITH BREAKFAST, Disp: 90 tablet, Rfl: 1   Continuous Glucose Sensor (DEXCOM G7 SENSOR) MISC, SMARTSIG:1 Every 10 Days, Disp: , Rfl:    FARXIGA 10 MG TABS tablet, Take 10 mg by mouth daily., Disp: , Rfl:    insulin detemir (LEVEMIR) 100 UNIT/ML injection, Inject into the skin., Disp: , Rfl:    insulin lispro (HUMALOG) 100 UNIT/ML KwikPen, Inject 10 Units into the skin 3 (three) times daily before meals. , Disp: , Rfl:    LANTUS SOLOSTAR 100 UNIT/ML Solostar Pen, Inject 14 Units into the skin at bedtime., Disp: , Rfl:    olmesartan (BENICAR) 5 MG tablet, Take 5 mg by mouth daily., Disp: , Rfl:    ONETOUCH ULTRA test strip, 1 each daily., Disp: , Rfl:    pravastatin (PRAVACHOL) 10 MG tablet, Take 10 mg by mouth daily., Disp: , Rfl:    VELTASSA 8.4 g packet, daily., Disp: , Rfl:    Allergies: Allergies  Allergen Reactions   Ampicillin Swelling   Clindamycin/Lincomycin     REVIEW OF SYSTEMS:   Review of Systems  Constitutional:  Negative for chills, fatigue and fever.  HENT:   Negative for lump/mass, mouth sores, nosebleeds, sore throat and trouble swallowing.   Eyes:  Negative for eye problems.  Respiratory:  Negative for cough and shortness of breath.   Cardiovascular:  Negative for chest pain, leg swelling and palpitations.  Gastrointestinal:  Negative for abdominal pain, constipation, diarrhea, nausea and vomiting.  Genitourinary:  Negative for bladder incontinence, difficulty urinating,  dysuria, frequency, hematuria and nocturia.   Musculoskeletal:  Negative for arthralgias, back pain, flank pain, myalgias and neck pain.  Skin:  Negative for itching and rash.  Neurological:  Positive for dizziness. Negative for headaches and numbness.  Hematological:  Does not bruise/bleed easily.  Psychiatric/Behavioral:  Positive for sleep disturbance. Negative for depression and  suicidal ideas. The patient is not nervous/anxious.   All other systems reviewed and are negative.    VITALS:   Blood pressure (!) 109/50, pulse 64, temperature (!) 96.7 F (35.9 C), temperature source Tympanic, resp. rate 20, weight 144 lb 13.5 oz (65.7 kg), SpO2 100%.  Wt Readings from Last 3 Encounters:  03/20/24 144 lb 13.5 oz (65.7 kg)  03/08/24 148 lb (67.1 kg)  09/15/23 154 lb (69.9 kg)    Body mass index is 23.38 kg/m.   PHYSICAL EXAM:   Physical Exam Vitals and nursing note reviewed. Exam conducted with a chaperone present.  Constitutional:      Appearance: Normal appearance.  Cardiovascular:     Rate and Rhythm: Normal rate and regular rhythm.     Pulses: Normal pulses.     Heart sounds: Normal heart sounds.  Pulmonary:     Effort: Pulmonary effort is normal.     Breath sounds: Normal breath sounds.  Abdominal:     Palpations: Abdomen is soft. There is no hepatomegaly, splenomegaly or mass.     Tenderness: There is no abdominal tenderness.  Musculoskeletal:     Right lower leg: No edema.     Left lower leg: No edema.  Lymphadenopathy:     Cervical: No cervical adenopathy.     Right cervical: No superficial, deep or posterior cervical adenopathy.    Left cervical: No superficial, deep or posterior cervical adenopathy.     Upper Body:     Right upper body: No supraclavicular or axillary adenopathy.     Left upper body: No supraclavicular or axillary adenopathy.  Neurological:     General: No focal deficit present.     Mental Status: She is alert and oriented to person, place, and time.  Psychiatric:        Mood and Affect: Mood normal.        Behavior: Behavior normal.     LABS:   CBC    Component Value Date/Time   WBC 5.4 03/08/2024 1409   RBC 3.68 (L) 03/08/2024 1410   RBC 3.57 (L) 03/08/2024 1409   HGB 9.7 (L) 03/08/2024 1409   HCT 30.9 (L) 03/08/2024 1409   PLT 218 03/08/2024 1409   MCV 86.6 03/08/2024 1409   MCH 27.2 03/08/2024 1409   MCHC  31.4 03/08/2024 1409   RDW 13.9 03/08/2024 1409   LYMPHSABS 1.3 03/08/2024 1409   MONOABS 0.4 03/08/2024 1409   EOSABS 0.1 03/08/2024 1409   BASOSABS 0.0 03/08/2024 1409    CMP    Component Value Date/Time   CALCIUM 10.4 09/08/2023 1306    No results found for: CEA1, CEA / No results found for: CEA1, CEA No results found for: PSA1 No results found for: CAN199 No results found for: RJW874  Lab Results  Component Value Date   TOTALPROTELP 6.5 03/08/2024   TOTALPROTELP 6.5 03/08/2024   ALBUMINELP 3.3 03/08/2024   A1GS 0.2 03/08/2024   A2GS 0.6 03/08/2024   BETS 0.9 03/08/2024   GAMS 1.5 03/08/2024   MSPIKE 0.4 (H) 03/08/2024   SPEI Comment 03/08/2024   Lab Results  Component Value Date   TIBC 232 (  L) 03/08/2024   FERRITIN 45 03/08/2024   IRONPCTSAT 30 03/08/2024   Lab Results  Component Value Date   LDH 111 03/08/2024     STUDIES:   DG Bone Survey Met Result Date: 03/11/2024 CLINICAL DATA:  MGUS EXAM: METASTATIC BONE SURVEY COMPARISON:  Bone series 02/04/2017 FINDINGS: No evidence of lytic or destructive bone lesion. Remote healed distal right radius fracture. Remote right fibular fracture. Postsurgical change of the left ankle. Diffuse degenerative change throughout the spine. Stable calcified granuloma in the left lung. Diffuse aortic atherosclerosis and peripheral vascular calcifications. Stable peripherally calcified structure in the left abdomen measuring 2.4 cm. Multiple surgical clips in the abdomen. IMPRESSION: 1. No evidence of lytic or destructive bone lesion. 2. Remote healed distal right radius fracture. Remote right fibular fracture. 3. Diffuse degenerative change throughout the spine. Electronically Signed   By: Andrea Gasman M.D.   On: 03/11/2024 18:58

## 2024-03-20 NOTE — Patient Instructions (Signed)
 [facility] at [facility] Discharge Instructions   You were seen and examined today by Dr. Rogers.  He reviewed the results of your lab work which are mostly normal/stable. Your iron is low. Dr. MARLA recommends you receive an iron infusion here in the clinic to correct this.   We will see you back in    Return as scheduled.    Thank you for choosing [facility] at [facility] to provide your oncology and hematology care.  To afford each patient quality time with our provider, please arrive at least 15 minutes before your scheduled appointment time.   If you have a lab appointment with the Cancer Center please come in thru the Main Entrance and check in at the main information desk.  You need to re-schedule your appointment should you arrive 10 or more minutes late.  We strive to give you quality time with our providers, and arriving late affects you and other patients whose appointments are after yours.  Also, if you no show three or more times for appointments you may be dismissed from the clinic at the providers discretion.     Again, thank you for choosing [facility].  Our hope is that these requests will decrease the amount of time that you wait before being seen by our physicians.       _____________________________________________________________  Should you have questions after your visit to [facility], please contact our office at [phone] and follow the prompts.  Our office hours are 8:00 a.m. and 4:30 p.m. Monday - Friday.  Please note that voicemails left after 4:00 p.m. may not be returned until the following business day.  We are closed weekends and major holidays.  You do have access to a nurse 24-7, just call the main number to the clinic [phone] and do not press any options, hold on the line and a nurse will answer the phone.    For prescription refill requests, have your pharmacy contact our  office and allow 72 hours.    Due to Covid, you will need to wear a mask upon entering the hospital. If you do not have a mask, a mask will be given to you at the Main Entrance upon arrival. For doctor visits, patients may have 1 support person age 72 or older with them. For treatment visits, patients can not have anyone with them due to social distancing guidelines and our immunocompromised population.

## 2024-03-24 DIAGNOSIS — R52 Pain, unspecified: Secondary | ICD-10-CM | POA: Diagnosis not present

## 2024-03-24 DIAGNOSIS — J449 Chronic obstructive pulmonary disease, unspecified: Secondary | ICD-10-CM | POA: Diagnosis not present

## 2024-03-24 DIAGNOSIS — I10 Essential (primary) hypertension: Secondary | ICD-10-CM | POA: Diagnosis not present

## 2024-03-24 DIAGNOSIS — E1169 Type 2 diabetes mellitus with other specified complication: Secondary | ICD-10-CM | POA: Diagnosis not present

## 2024-03-24 DIAGNOSIS — Z299 Encounter for prophylactic measures, unspecified: Secondary | ICD-10-CM | POA: Diagnosis not present

## 2024-03-24 DIAGNOSIS — N1832 Chronic kidney disease, stage 3b: Secondary | ICD-10-CM | POA: Diagnosis not present

## 2024-03-24 DIAGNOSIS — R519 Headache, unspecified: Secondary | ICD-10-CM | POA: Diagnosis not present

## 2024-03-29 ENCOUNTER — Inpatient Hospital Stay

## 2024-03-29 VITALS — BP 132/54 | HR 65 | Temp 97.8°F | Resp 18 | Ht 66.0 in | Wt 149.0 lb

## 2024-03-29 DIAGNOSIS — D472 Monoclonal gammopathy: Secondary | ICD-10-CM | POA: Diagnosis not present

## 2024-03-29 DIAGNOSIS — D509 Iron deficiency anemia, unspecified: Secondary | ICD-10-CM | POA: Diagnosis not present

## 2024-03-29 DIAGNOSIS — R7989 Other specified abnormal findings of blood chemistry: Secondary | ICD-10-CM | POA: Diagnosis not present

## 2024-03-29 DIAGNOSIS — Z803 Family history of malignant neoplasm of breast: Secondary | ICD-10-CM | POA: Diagnosis not present

## 2024-03-29 DIAGNOSIS — D508 Other iron deficiency anemias: Secondary | ICD-10-CM

## 2024-03-29 DIAGNOSIS — N183 Chronic kidney disease, stage 3 unspecified: Secondary | ICD-10-CM | POA: Diagnosis not present

## 2024-03-29 DIAGNOSIS — D631 Anemia in chronic kidney disease: Secondary | ICD-10-CM | POA: Diagnosis not present

## 2024-03-29 DIAGNOSIS — Z87891 Personal history of nicotine dependence: Secondary | ICD-10-CM | POA: Diagnosis not present

## 2024-03-29 DIAGNOSIS — Z800 Family history of malignant neoplasm of digestive organs: Secondary | ICD-10-CM | POA: Diagnosis not present

## 2024-03-29 MED ORDER — SODIUM CHLORIDE 0.9 % IV SOLN: INTRAVENOUS | Status: DC

## 2024-03-29 MED ORDER — FAMOTIDINE IN NACL 20-0.9 MG/50ML-% IV SOLN
20.0000 mg | Freq: Once | INTRAVENOUS | Status: AC
  Administered 2024-03-29: 20 mg via INTRAVENOUS
  Filled 2024-03-29: qty 50

## 2024-03-29 MED ORDER — ACETAMINOPHEN 325 MG PO TABS
650.0000 mg | ORAL_TABLET | Freq: Once | ORAL | Status: AC
  Administered 2024-03-29: 650 mg via ORAL
  Filled 2024-03-29: qty 2

## 2024-03-29 MED ORDER — SODIUM CHLORIDE 0.9 % IV SOLN
950.0000 mg | Freq: Once | INTRAVENOUS | Status: AC
  Administered 2024-03-29: 950 mg via INTRAVENOUS
  Filled 2024-03-29: qty 19

## 2024-03-29 MED ORDER — CETIRIZINE HCL 10 MG/ML IV SOLN
10.0000 mg | Freq: Once | INTRAVENOUS | Status: AC
  Administered 2024-03-29: 10 mg via INTRAVENOUS
  Filled 2024-03-29: qty 1

## 2024-03-29 MED ORDER — METHYLPREDNISOLONE SODIUM SUCC 125 MG IJ SOLR
125.0000 mg | Freq: Once | INTRAMUSCULAR | Status: AC
  Administered 2024-03-29: 125 mg via INTRAVENOUS
  Filled 2024-03-29: qty 2

## 2024-03-29 MED ORDER — SODIUM CHLORIDE 0.9 % IV SOLN
50.0000 mg | Freq: Once | INTRAVENOUS | Status: AC
  Administered 2024-03-29: 50 mg via INTRAVENOUS
  Filled 2024-03-29: qty 1

## 2024-03-29 NOTE — Progress Notes (Signed)
 Patient tolerated iron infusion with no complaints voiced.  Peripheral IV site clean and dry with good blood return noted before and after infusion.  Band aid applied.  VSS with discharge and left in satisfactory condition with no s/s of distress noted.

## 2024-03-29 NOTE — Patient Instructions (Signed)

## 2024-04-07 DIAGNOSIS — E1129 Type 2 diabetes mellitus with other diabetic kidney complication: Secondary | ICD-10-CM | POA: Diagnosis not present

## 2024-04-07 DIAGNOSIS — N1832 Chronic kidney disease, stage 3b: Secondary | ICD-10-CM | POA: Diagnosis not present

## 2024-04-07 DIAGNOSIS — I10 Essential (primary) hypertension: Secondary | ICD-10-CM | POA: Diagnosis not present

## 2024-04-07 DIAGNOSIS — Z299 Encounter for prophylactic measures, unspecified: Secondary | ICD-10-CM | POA: Diagnosis not present

## 2024-04-07 DIAGNOSIS — Z0000 Encounter for general adult medical examination without abnormal findings: Secondary | ICD-10-CM | POA: Diagnosis not present

## 2024-04-07 DIAGNOSIS — J449 Chronic obstructive pulmonary disease, unspecified: Secondary | ICD-10-CM | POA: Diagnosis not present

## 2024-04-11 DIAGNOSIS — M79671 Pain in right foot: Secondary | ICD-10-CM | POA: Diagnosis not present

## 2024-04-11 DIAGNOSIS — E1140 Type 2 diabetes mellitus with diabetic neuropathy, unspecified: Secondary | ICD-10-CM | POA: Diagnosis not present

## 2024-04-11 DIAGNOSIS — I739 Peripheral vascular disease, unspecified: Secondary | ICD-10-CM | POA: Diagnosis not present

## 2024-04-11 DIAGNOSIS — M79672 Pain in left foot: Secondary | ICD-10-CM | POA: Diagnosis not present

## 2024-04-11 DIAGNOSIS — L110 Acquired keratosis follicularis: Secondary | ICD-10-CM | POA: Diagnosis not present

## 2024-05-16 DIAGNOSIS — E7800 Pure hypercholesterolemia, unspecified: Secondary | ICD-10-CM | POA: Diagnosis not present

## 2024-05-16 DIAGNOSIS — Z299 Encounter for prophylactic measures, unspecified: Secondary | ICD-10-CM | POA: Diagnosis not present

## 2024-05-16 DIAGNOSIS — Z0000 Encounter for general adult medical examination without abnormal findings: Secondary | ICD-10-CM | POA: Diagnosis not present

## 2024-05-16 DIAGNOSIS — N2581 Secondary hyperparathyroidism of renal origin: Secondary | ICD-10-CM | POA: Diagnosis not present

## 2024-05-16 DIAGNOSIS — Z7189 Other specified counseling: Secondary | ICD-10-CM | POA: Diagnosis not present

## 2024-05-16 DIAGNOSIS — R5383 Other fatigue: Secondary | ICD-10-CM | POA: Diagnosis not present

## 2024-05-16 DIAGNOSIS — Z23 Encounter for immunization: Secondary | ICD-10-CM | POA: Diagnosis not present

## 2024-06-13 ENCOUNTER — Inpatient Hospital Stay: Attending: Hematology

## 2024-06-13 ENCOUNTER — Other Ambulatory Visit (HOSPITAL_COMMUNITY)
Admission: RE | Admit: 2024-06-13 | Discharge: 2024-06-13 | Disposition: A | Discharge status: Home / Self Care | Source: Ambulatory Visit | Attending: Nephrology | Admitting: Nephrology

## 2024-06-13 DIAGNOSIS — N189 Chronic kidney disease, unspecified: Secondary | ICD-10-CM | POA: Diagnosis not present

## 2024-06-13 DIAGNOSIS — D472 Monoclonal gammopathy: Secondary | ICD-10-CM | POA: Insufficient documentation

## 2024-06-13 DIAGNOSIS — D509 Iron deficiency anemia, unspecified: Secondary | ICD-10-CM | POA: Diagnosis present

## 2024-06-13 DIAGNOSIS — D508 Other iron deficiency anemias: Secondary | ICD-10-CM

## 2024-06-13 DIAGNOSIS — D631 Anemia in chronic kidney disease: Secondary | ICD-10-CM | POA: Insufficient documentation

## 2024-06-13 DIAGNOSIS — E611 Iron deficiency: Secondary | ICD-10-CM | POA: Insufficient documentation

## 2024-06-13 LAB — CBC
HCT: 32.1 % — ABNORMAL LOW (ref 36.0–46.0)
HCT: 32.3 % — ABNORMAL LOW (ref 36.0–46.0)
Hemoglobin: 9.9 g/dL — ABNORMAL LOW (ref 12.0–15.0)
Hemoglobin: 9.9 g/dL — ABNORMAL LOW (ref 12.0–15.0)
MCH: 27.2 pg (ref 26.0–34.0)
MCH: 27.3 pg (ref 26.0–34.0)
MCHC: 30.7 g/dL (ref 30.0–36.0)
MCHC: 30.8 g/dL (ref 30.0–36.0)
MCV: 88.7 fL (ref 80.0–100.0)
MCV: 88.7 fL (ref 80.0–100.0)
Platelets: 208 K/uL (ref 150–400)
Platelets: 215 K/uL (ref 150–400)
RBC: 3.62 MIL/uL — ABNORMAL LOW (ref 3.87–5.11)
RBC: 3.64 MIL/uL — ABNORMAL LOW (ref 3.87–5.11)
RDW: 13.7 % (ref 11.5–15.5)
RDW: 13.8 % (ref 11.5–15.5)
WBC: 4.5 K/uL (ref 4.0–10.5)
WBC: 4.6 K/uL (ref 4.0–10.5)
nRBC: 0 % (ref 0.0–0.2)
nRBC: 0 % (ref 0.0–0.2)

## 2024-06-13 LAB — PROTEIN / CREATININE RATIO, URINE
Creatinine, Urine: 119 mg/dL
Protein Creatinine Ratio: 0.16 mg/mg{creat} — ABNORMAL HIGH (ref 0.00–0.15)
Total Protein, Urine: 19 mg/dL

## 2024-06-13 LAB — RENAL FUNCTION PANEL
Albumin: 3.9 g/dL (ref 3.5–5.0)
Anion gap: 7 (ref 5–15)
BUN: 46 mg/dL — ABNORMAL HIGH (ref 8–23)
CO2: 17 mmol/L — ABNORMAL LOW (ref 22–32)
Calcium: 11.3 mg/dL — ABNORMAL HIGH (ref 8.9–10.3)
Chloride: 110 mmol/L (ref 98–111)
Creatinine, Ser: 1.62 mg/dL — ABNORMAL HIGH (ref 0.44–1.00)
GFR, Estimated: 31 mL/min — ABNORMAL LOW (ref >60)
Glucose, Bld: 160 mg/dL — ABNORMAL HIGH (ref 70–99)
Phosphorus: 3 mg/dL (ref 2.5–4.6)
Potassium: 6.2 mmol/L — ABNORMAL HIGH (ref 3.5–5.1)
Sodium: 133 mmol/L — ABNORMAL LOW (ref 135–145)

## 2024-06-13 LAB — IRON AND TIBC
Iron: 71 ug/dL (ref 28–170)
Iron: 71 ug/dL (ref 28–170)
Saturation Ratios: 37 % — ABNORMAL HIGH (ref 10.4–31.8)
Saturation Ratios: 37 % — ABNORMAL HIGH (ref 10.4–31.8)
TIBC: 192 ug/dL — ABNORMAL LOW (ref 250–450)
TIBC: 190 ug/dL — ABNORMAL LOW (ref 250–450)
UIBC: 121 ug/dL
UIBC: 119 ug/dL

## 2024-06-13 LAB — FERRITIN
Ferritin: 375 ng/mL — ABNORMAL HIGH (ref 11–307)
Ferritin: 382 ng/mL — ABNORMAL HIGH (ref 11–307)

## 2024-06-14 DIAGNOSIS — E875 Hyperkalemia: Secondary | ICD-10-CM | POA: Diagnosis not present

## 2024-06-14 DIAGNOSIS — R809 Proteinuria, unspecified: Secondary | ICD-10-CM | POA: Diagnosis not present

## 2024-06-14 DIAGNOSIS — N1832 Chronic kidney disease, stage 3b: Secondary | ICD-10-CM | POA: Diagnosis not present

## 2024-06-14 DIAGNOSIS — E1129 Type 2 diabetes mellitus with other diabetic kidney complication: Secondary | ICD-10-CM | POA: Diagnosis not present

## 2024-06-14 LAB — PARATHYROID HORMONE, INTACT (NO CA): PTH: 79 pg/mL — ABNORMAL HIGH (ref 15–65)

## 2024-06-20 ENCOUNTER — Inpatient Hospital Stay: Admitting: Oncology

## 2024-06-28 DIAGNOSIS — N189 Chronic kidney disease, unspecified: Secondary | ICD-10-CM | POA: Diagnosis not present

## 2024-07-05 ENCOUNTER — Inpatient Hospital Stay: Attending: Hematology | Admitting: Oncology

## 2024-07-05 DIAGNOSIS — D508 Other iron deficiency anemias: Secondary | ICD-10-CM | POA: Diagnosis not present

## 2024-07-05 DIAGNOSIS — R7689 Other specified abnormal immunological findings in serum: Secondary | ICD-10-CM | POA: Diagnosis not present

## 2024-07-05 DIAGNOSIS — Z800 Family history of malignant neoplasm of digestive organs: Secondary | ICD-10-CM | POA: Diagnosis not present

## 2024-07-05 DIAGNOSIS — D509 Iron deficiency anemia, unspecified: Secondary | ICD-10-CM | POA: Diagnosis present

## 2024-07-05 DIAGNOSIS — Z87891 Personal history of nicotine dependence: Secondary | ICD-10-CM | POA: Insufficient documentation

## 2024-07-05 DIAGNOSIS — D472 Monoclonal gammopathy: Secondary | ICD-10-CM | POA: Insufficient documentation

## 2024-07-05 DIAGNOSIS — N183 Chronic kidney disease, stage 3 unspecified: Secondary | ICD-10-CM | POA: Insufficient documentation

## 2024-07-05 DIAGNOSIS — D631 Anemia in chronic kidney disease: Secondary | ICD-10-CM | POA: Insufficient documentation

## 2024-07-05 DIAGNOSIS — Z803 Family history of malignant neoplasm of breast: Secondary | ICD-10-CM | POA: Insufficient documentation

## 2024-07-05 NOTE — Progress Notes (Signed)
 [facility] [address] [place] [address]   Clinic Day:  07/05/2024  Referring physician: [name], MD  Patient Care Team: [name], MD as PCP - General (Internal Medicine)   ASSESSMENT & PLAN:   Assessment:  1.  Normocytic anemia: - Patient seen at the request of Dr. Rachele - 02/03/2024: Creatinine-1.37, Hb-9.6, MCV-86, WBC-5, PLT-223.  Calcium is 10.6, albumin 3.7.  Intact PTH elevated at 162. - Denies BRBPR/melena.  She is not on oral iron  therapy.  No prior history of transfusion.  No ice pica. - 6 pound weight loss since January 2025 due to decreased appetite.  2.  Monoclonal gammopathy: - Myeloma labs (03/08/2024): M spike 0.4 g.  Immunofixation shows IgA kappa and IgG kappa monoclonal protein.  Kappa light chains are elevated at 101, lambda light chains 32.9 and ratio of 3.09. - Skeletal survey (03/08/2024): No evidence of lytic or destructive bone lesions.  3.  Social/family history: - She lives by herself and is independent of ADLs and ADLs.  She uses a cane for ambulation.  She quit smoking more than 30 years ago. - Father had colon cancer.  Maternal aunt had breast cancer.  Plan:  1.  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 -She received a dose of INFeD  on 03/29/2024 with good tolerance. -Repeat labs from 06/13/2024 show iron  saturation 37%, ferritin 382 with a low TIBC.  Hemoglobin is 9.9. -No additional IV iron  needed at this time. -She has CKD and likely would benefit from Retacrit but she would like to hold off at this time. -We discussed Retacrit and side effects.  I will get this preauthorized for when she returns in 3 months. - RTC 3 months with repeat CBC, ferritin and iron  panel.  2.  IgA kappa/IgG kappa monoclonal gammopathy: - We have reviewed myeloma labs from 03/08/2024: M spike is 0.4 g.  FLC ratio is 3.07.  Immunofixation shows biclonal MGUS. - We reviewed skeletal survey from 03/08/2024: No lytic  lesions. - We discussed pathophysiology and prognosis of MGUS.  No crab features at this time to warrant any further treatment.  Will continue monitoring myeloma labs every 6 months and skeletal survey once a year. -Repeat MGUS labs in 3 months.   Orders Placed This Encounter  Procedures   Ferritin    Standing Status:   Future    Expected Date:   10/05/2024    Expiration Date:   01/03/2025   Iron  and TIBC ([facility] ONLY)    Standing Status:   Future    Expected Date:   10/05/2024    Expiration Date:   01/03/2025   Protein electrophoresis, serum    Standing Status:   Future    Expected Date:   10/05/2024    Expiration Date:   01/03/2025   Kappa/lambda light chains    Standing Status:   Future    Expected Date:   10/05/2024    Expiration Date:   01/03/2025   Lactate dehydrogenase    Standing Status:   Future    Expected Date:   10/05/2024    Expiration Date:   01/03/2025   Comprehensive metabolic panel    Standing Status:   Future    Expected Date:   10/05/2024    Expiration Date:   01/03/2025   CBC with Differential    Standing Status:   Future    Expected Date:   10/05/2024    Expiration Date:  01/03/2025     Jennifer FORBES Hope, Jennifer Anthony   11/5/20252:06 PM  CHIEF COMPLAINT/PURPOSE OF CONSULT:   Diagnosis: Normocytic anemia and MGUS  Current Therapy: Under workup  HISTORY OF PRESENT ILLNESS:   Jennifer Anthony is a 83 y.o. female presenting to clinic today for evaluation of anemia at the request of Rachele Amour, MD.  Patient has a medical history of hypertension, DM type II, CKD stage 3, hyperparathyroidism, MGUS, and adenocarcinoma of colon.   Jennifer Anthony was initially evaluated by Dr. Rogers on 03/08/2024 and found to have low iron  levels.  She was given 1 dose of INFeD  on 03/29/2024 with great tolerance.  She is currently not taking iron  supplements.  Reports her energy levels are stable but chronically low.  Reports feeling tired.  Denies any melena, hematochezia or bright red  blood per rectum.  She is a normal American diet.    Patient had labs drawn by Dr. Juanito office and found to have an elevated potassium level.  She was instructed to take Veltassa daily.  She has follow-up with Dr. Waldemar on Friday.  Today, she states that she is doing well overall. Her appetite level is at 75%. Her energy level is at 50%.   INTERVAL HISTORY:   Jennifer Anthony is a 83 y.o. female presenting to the clinic today for follow-up of MGUS and normocytic anemia. She was last seen by me on 03/08/2024.  Since her last visit, she underwent bone survey on 03/08/2024 that found: No evidence of lytic or destructive bone lesion. Remote healed distal right radius fracture. Remote right fibular fracture. Diffuse degenerative change throughout the spine.  Today, she states that she is doing well overall. Her appetite level is at 50%. Her energy level is at 25%.   PAST MEDICAL HISTORY:   Past Medical History: Past Medical History:  Diagnosis Date   Adenocarcinoma of colon (HCC)    CKD (chronic kidney disease), stage III (HCC)    Diabetes mellitus    Diabetes mellitus, type II (HCC)    Hyperkalemia    Hypertension    Hypertension    Osteoarthritis     Surgical History: Past Surgical History:  Procedure Laterality Date   ABDOMINAL HYSTERECTOMY     ANKLE FRACTURE SURGERY     CATARACT EXTRACTION, BILATERAL     COLON SURGERY      Social History: Social History   Socioeconomic History   Marital status: Widowed    Spouse name: Not on file   Number of children: Not on file   Years of education: Not on file   Highest education level: Not on file  Occupational History   Not on file  Tobacco Use   Smoking status: Former    Current packs/day: 0.00    Average packs/day: 0.5 packs/day for 20.0 years (10.0 ttl pk-yrs)    Types: Cigarettes    Start date: 36    Quit date: 2000    Years since quitting: 25.8   Smokeless tobacco: Never  Vaping Use   Vaping status: Never  Used  Substance and Sexual Activity   Alcohol use: No   Drug use: No   Sexual activity: Not on file    Comment: widowed  Other Topics Concern   Not on file  Social History Narrative   Not on file   Social Drivers of Health   Financial Resource Strain: Low Risk  (08/10/2022)   Received from [facility]   Overall Financial Resource Strain (CARDIA)  Difficulty of Paying Living Expenses: Not hard at all  Food Insecurity: Food Insecurity Present (03/08/2024)   Hunger Vital Sign    Worried About Running Out of Food in the Last Year: Sometimes true    Ran Out of Food in the Last Year: Sometimes true  Transportation Needs: No Transportation Needs (03/08/2024)   PRAPARE - Administrator, Civil Service (Medical): No    Lack of Transportation (Non-Medical): No  Physical Activity: Not on file  Stress: Not on file  Social Connections: Not on file  Intimate Partner Violence: Not At Risk (03/08/2024)   Humiliation, Afraid, Rape, and Kick questionnaire    Fear of Current or Ex-Partner: No    Emotionally Abused: No    Physically Abused: No    Sexually Abused: No    Family History: Family History  Problem Relation Age of Onset   Stroke Mother    Heart failure Mother    Cancer Father     Current Medications:  Current Outpatient Medications:    amLODipine (NORVASC) 5 MG tablet, Take 5 mg by mouth daily., Disp: , Rfl:    aspirin 81 MG tablet, Take 81 mg by mouth daily.  , Disp: , Rfl:    Cholecalciferol (VITAMIN D-3) 25 MCG (1000 UT) CAPS, Take 1 capsule by mouth daily., Disp: , Rfl:    Continuous Glucose Sensor (DEXCOM G7 SENSOR) MISC, SMARTSIG:1 Every 10 Days, Disp: , Rfl:    FARXIGA 10 MG TABS tablet, Take 10 mg by mouth daily., Disp: , Rfl:    insulin detemir (LEVEMIR) 100 UNIT/ML injection, Inject into the skin., Disp: , Rfl:    insulin lispro (HUMALOG) 100 UNIT/ML KwikPen, Inject 10 Units into the skin 3 (three) times daily before meals. , Disp: , Rfl:    LANTUS  SOLOSTAR 100 UNIT/ML Solostar Pen, Inject 14 Units into the skin at bedtime., Disp: , Rfl:    ONETOUCH ULTRA test strip, 1 each daily., Disp: , Rfl:    pravastatin (PRAVACHOL) 10 MG tablet, Take 10 mg by mouth daily., Disp: , Rfl:    VELTASSA 8.4 g packet, daily., Disp: , Rfl:    Allergies: Allergies  Allergen Reactions   Ampicillin Swelling   Clindamycin/Lincomycin     REVIEW OF SYSTEMS:   Review of Systems  Constitutional:  Positive for fatigue.  Psychiatric/Behavioral:  Positive for sleep disturbance.      VITALS:   Blood pressure 139/60, pulse 60, temperature 97.9 F (36.6 C), temperature source Tympanic, resp. rate 17, height 5' 6 (1.676 m), weight 154 lb (69.9 kg), SpO2 100%.  Wt Readings from Last 3 Encounters:  07/05/24 154 lb (69.9 kg)  03/29/24 149 lb (67.6 kg)  03/20/24 144 lb 13.5 oz (65.7 kg)    Body mass index is 24.86 kg/m.   PHYSICAL EXAM:   Physical Exam Constitutional:      Appearance: Normal appearance.  Cardiovascular:     Rate and Rhythm: Normal rate and regular rhythm.  Pulmonary:     Effort: Pulmonary effort is normal.     Breath sounds: Normal breath sounds.  Abdominal:     General: Bowel sounds are normal.     Palpations: Abdomen is soft.  Musculoskeletal:        General: No swelling. Normal range of motion.  Neurological:     Mental Status: She is alert and oriented to person, place, and time. Mental status is at baseline.     LABS:   CBC    Component  Value Date/Time   WBC 4.5 06/13/2024 1352   RBC 3.62 (L) 06/13/2024 1352   HGB 9.9 (L) 06/13/2024 1352   HCT 32.1 (L) 06/13/2024 1352   PLT 208 06/13/2024 1352   MCV 88.7 06/13/2024 1352   MCH 27.3 06/13/2024 1352   MCHC 30.8 06/13/2024 1352   RDW 13.8 06/13/2024 1352   LYMPHSABS 1.3 03/08/2024 1409   MONOABS 0.4 03/08/2024 1409   EOSABS 0.1 03/08/2024 1409   BASOSABS 0.0 03/08/2024 1409    CMP    Component Value Date/Time   NA 133 (L) 06/13/2024 1352   K 6.2 (H)  06/13/2024 1352   CL 110 06/13/2024 1352   CO2 17 (L) 06/13/2024 1352   GLUCOSE 160 (H) 06/13/2024 1352   BUN 46 (H) 06/13/2024 1352   CREATININE 1.62 (H) 06/13/2024 1352   CALCIUM 11.3 (H) 06/13/2024 1352   ALBUMIN 3.9 06/13/2024 1352   GFRNONAA 31 (L) 06/13/2024 1352    No results found for: CEA1, CEA / No results found for: CEA1, CEA No results found for: PSA1 No results found for: CAN199 No results found for: RJW874  Lab Results  Component Value Date   TOTALPROTELP 6.5 03/08/2024   TOTALPROTELP 6.5 03/08/2024   ALBUMINELP 3.3 03/08/2024   A1GS 0.2 03/08/2024   A2GS 0.6 03/08/2024   BETS 0.9 03/08/2024   GAMS 1.5 03/08/2024   MSPIKE 0.4 (H) 03/08/2024   SPEI Comment 03/08/2024   Lab Results  Component Value Date   TIBC 190 (L) 06/13/2024   TIBC 192 (L) 06/13/2024   TIBC 232 (L) 03/08/2024   FERRITIN 382 (H) 06/13/2024   FERRITIN 375 (H) 06/13/2024   FERRITIN 45 03/08/2024   IRONPCTSAT 37 (H) 06/13/2024   IRONPCTSAT 37 (H) 06/13/2024   IRONPCTSAT 30 03/08/2024   Lab Results  Component Value Date   LDH 111 03/08/2024     STUDIES:   No results found.

## 2024-07-10 ENCOUNTER — Telehealth: Payer: Self-pay | Admitting: "Endocrinology

## 2024-07-10 NOTE — Telephone Encounter (Signed)
Tried calling pt again no answer no vm  

## 2024-07-10 NOTE — Telephone Encounter (Signed)
 Pt called back and lvm , I called her back and no answer no VM

## 2024-07-10 NOTE — Telephone Encounter (Signed)
 Called pt to schedule her a follow up as soon as possible. She has not been seen in a while and her Nephrologist sent over her labs. No answer when calling

## 2024-07-13 ENCOUNTER — Inpatient Hospital Stay (HOSPITAL_COMMUNITY)
Admission: EM | Admit: 2024-07-13 | Discharge: 2024-07-16 | DRG: 690 | Disposition: A | Discharge status: Home Health | Attending: Emergency Medicine | Admitting: Emergency Medicine

## 2024-07-13 ENCOUNTER — Emergency Department (HOSPITAL_COMMUNITY)

## 2024-07-13 ENCOUNTER — Encounter (HOSPITAL_COMMUNITY): Payer: Self-pay | Admitting: Internal Medicine

## 2024-07-13 ENCOUNTER — Other Ambulatory Visit: Payer: Self-pay

## 2024-07-13 DIAGNOSIS — Z1611 Resistance to penicillins: Secondary | ICD-10-CM | POA: Diagnosis present

## 2024-07-13 DIAGNOSIS — E1165 Type 2 diabetes mellitus with hyperglycemia: Secondary | ICD-10-CM | POA: Diagnosis present

## 2024-07-13 DIAGNOSIS — I2489 Other forms of acute ischemic heart disease: Secondary | ICD-10-CM | POA: Diagnosis present

## 2024-07-13 DIAGNOSIS — N390 Urinary tract infection, site not specified: Principal | ICD-10-CM | POA: Diagnosis present

## 2024-07-13 DIAGNOSIS — N2581 Secondary hyperparathyroidism of renal origin: Secondary | ICD-10-CM | POA: Diagnosis present

## 2024-07-13 DIAGNOSIS — I10 Essential (primary) hypertension: Secondary | ICD-10-CM

## 2024-07-13 DIAGNOSIS — Z794 Long term (current) use of insulin: Secondary | ICD-10-CM | POA: Diagnosis not present

## 2024-07-13 DIAGNOSIS — E860 Dehydration: Secondary | ICD-10-CM | POA: Diagnosis present

## 2024-07-13 DIAGNOSIS — N10 Acute tubulo-interstitial nephritis: Secondary | ICD-10-CM | POA: Insufficient documentation

## 2024-07-13 DIAGNOSIS — D631 Anemia in chronic kidney disease: Secondary | ICD-10-CM | POA: Diagnosis present

## 2024-07-13 DIAGNOSIS — N179 Acute kidney failure, unspecified: Secondary | ICD-10-CM | POA: Diagnosis present

## 2024-07-13 DIAGNOSIS — R7989 Other specified abnormal findings of blood chemistry: Secondary | ICD-10-CM

## 2024-07-13 DIAGNOSIS — Z881 Allergy status to other antibiotic agents status: Secondary | ICD-10-CM

## 2024-07-13 DIAGNOSIS — D472 Monoclonal gammopathy: Secondary | ICD-10-CM | POA: Diagnosis present

## 2024-07-13 DIAGNOSIS — E782 Mixed hyperlipidemia: Secondary | ICD-10-CM | POA: Diagnosis present

## 2024-07-13 DIAGNOSIS — Z79899 Other long term (current) drug therapy: Secondary | ICD-10-CM

## 2024-07-13 DIAGNOSIS — E210 Primary hyperparathyroidism: Secondary | ICD-10-CM | POA: Insufficient documentation

## 2024-07-13 DIAGNOSIS — N12 Tubulo-interstitial nephritis, not specified as acute or chronic: Principal | ICD-10-CM

## 2024-07-13 DIAGNOSIS — Z8249 Family history of ischemic heart disease and other diseases of the circulatory system: Secondary | ICD-10-CM

## 2024-07-13 DIAGNOSIS — Z85038 Personal history of other malignant neoplasm of large intestine: Secondary | ICD-10-CM

## 2024-07-13 DIAGNOSIS — N184 Chronic kidney disease, stage 4 (severe): Secondary | ICD-10-CM | POA: Diagnosis present

## 2024-07-13 DIAGNOSIS — N1832 Chronic kidney disease, stage 3b: Secondary | ICD-10-CM | POA: Insufficient documentation

## 2024-07-13 DIAGNOSIS — Z90710 Acquired absence of both cervix and uterus: Secondary | ICD-10-CM

## 2024-07-13 DIAGNOSIS — E1122 Type 2 diabetes mellitus with diabetic chronic kidney disease: Secondary | ICD-10-CM | POA: Diagnosis present

## 2024-07-13 DIAGNOSIS — Z7982 Long term (current) use of aspirin: Secondary | ICD-10-CM | POA: Diagnosis not present

## 2024-07-13 DIAGNOSIS — Z87891 Personal history of nicotine dependence: Secondary | ICD-10-CM | POA: Diagnosis not present

## 2024-07-13 DIAGNOSIS — N178 Other acute kidney failure: Secondary | ICD-10-CM | POA: Diagnosis not present

## 2024-07-13 DIAGNOSIS — R9431 Abnormal electrocardiogram [ECG] [EKG]: Secondary | ICD-10-CM | POA: Diagnosis not present

## 2024-07-13 DIAGNOSIS — I129 Hypertensive chronic kidney disease with stage 1 through stage 4 chronic kidney disease, or unspecified chronic kidney disease: Secondary | ICD-10-CM | POA: Diagnosis present

## 2024-07-13 DIAGNOSIS — N189 Chronic kidney disease, unspecified: Secondary | ICD-10-CM

## 2024-07-13 DIAGNOSIS — E872 Acidosis, unspecified: Secondary | ICD-10-CM | POA: Diagnosis present

## 2024-07-13 DIAGNOSIS — B9689 Other specified bacterial agents as the cause of diseases classified elsewhere: Secondary | ICD-10-CM | POA: Diagnosis present

## 2024-07-13 DIAGNOSIS — E875 Hyperkalemia: Secondary | ICD-10-CM | POA: Diagnosis present

## 2024-07-13 LAB — GLUCOSE, CAPILLARY
Glucose-Capillary: 161 mg/dL — ABNORMAL HIGH (ref 70–99)
Glucose-Capillary: 274 mg/dL — ABNORMAL HIGH (ref 70–99)

## 2024-07-13 LAB — CBC
HCT: 34.5 % — ABNORMAL LOW (ref 36.0–46.0)
Hemoglobin: 10.3 g/dL — ABNORMAL LOW (ref 12.0–15.0)
MCH: 26.6 pg (ref 26.0–34.0)
MCHC: 29.9 g/dL — ABNORMAL LOW (ref 30.0–36.0)
MCV: 89.1 fL (ref 80.0–100.0)
Platelets: 234 K/uL (ref 150–400)
RBC: 3.87 MIL/uL (ref 3.87–5.11)
RDW: 14.5 % (ref 11.5–15.5)
WBC: 5.3 K/uL (ref 4.0–10.5)
nRBC: 0 % (ref 0.0–0.2)

## 2024-07-13 LAB — BLOOD GAS, VENOUS
Acid-base deficit: 12.8 mmol/L — ABNORMAL HIGH (ref 0.0–2.0)
Bicarbonate: 13.9 mmol/L — ABNORMAL LOW (ref 20.0–28.0)
Drawn by: 74825
FIO2: 21 %
O2 Saturation: 54.4 %
Patient temperature: 36.4
pCO2, Ven: 33 mmHg — ABNORMAL LOW (ref 44–60)
pH, Ven: 7.23 — ABNORMAL LOW (ref 7.25–7.43)
pO2, Ven: <31 mmHg — CL (ref 32–45)

## 2024-07-13 LAB — COMPREHENSIVE METABOLIC PANEL WITH GFR
ALT: 13 U/L (ref 0–44)
AST: 13 U/L — ABNORMAL LOW (ref 15–41)
Albumin: 4.1 g/dL (ref 3.5–5.0)
Alkaline Phosphatase: 141 U/L — ABNORMAL HIGH (ref 38–126)
Anion gap: 10 (ref 5–15)
BUN: 50 mg/dL — ABNORMAL HIGH (ref 8–23)
CO2: 12 mmol/L — ABNORMAL LOW (ref 22–32)
Calcium: 12.3 mg/dL — ABNORMAL HIGH (ref 8.9–10.3)
Chloride: 114 mmol/L — ABNORMAL HIGH (ref 98–111)
Creatinine, Ser: 1.98 mg/dL — ABNORMAL HIGH (ref 0.44–1.00)
GFR, Estimated: 24 mL/min — ABNORMAL LOW (ref >60)
Glucose, Bld: 221 mg/dL — ABNORMAL HIGH (ref 70–99)
Potassium: 5.9 mmol/L — ABNORMAL HIGH (ref 3.5–5.1)
Sodium: 135 mmol/L (ref 135–145)
Total Bilirubin: 0.3 mg/dL (ref 0.0–1.2)
Total Protein: 7.1 g/dL (ref 6.5–8.1)

## 2024-07-13 LAB — CBG MONITORING, ED
Glucose-Capillary: 163 mg/dL — ABNORMAL HIGH (ref 70–99)
Glucose-Capillary: 144 mg/dL — ABNORMAL HIGH (ref 70–99)

## 2024-07-13 LAB — BASIC METABOLIC PANEL WITH GFR
Anion gap: 9 (ref 5–15)
BUN: 49 mg/dL — ABNORMAL HIGH (ref 8–23)
CO2: 13 mmol/L — ABNORMAL LOW (ref 22–32)
Calcium: 12.4 mg/dL — ABNORMAL HIGH (ref 8.9–10.3)
Chloride: 116 mmol/L — ABNORMAL HIGH (ref 98–111)
Creatinine, Ser: 1.75 mg/dL — ABNORMAL HIGH (ref 0.44–1.00)
GFR, Estimated: 28 mL/min — ABNORMAL LOW (ref >60)
Glucose, Bld: 159 mg/dL — ABNORMAL HIGH (ref 70–99)
Potassium: 5.9 mmol/L — ABNORMAL HIGH (ref 3.5–5.1)
Sodium: 138 mmol/L (ref 135–145)

## 2024-07-13 LAB — URINALYSIS, ROUTINE W REFLEX MICROSCOPIC
Bilirubin Urine: NEGATIVE
Glucose, UA: NEGATIVE mg/dL
Hgb urine dipstick: NEGATIVE
Ketones, ur: NEGATIVE mg/dL
Nitrite: NEGATIVE
Protein, ur: 30 mg/dL — AB
Specific Gravity, Urine: 1.012 (ref 1.005–1.030)
WBC, UA: >50 WBC/hpf (ref 0–5)
pH: 5 (ref 5.0–8.0)

## 2024-07-13 LAB — LIPASE, BLOOD: Lipase: 22 U/L (ref 11–51)

## 2024-07-13 MED ORDER — INSULIN ASPART 100 UNIT/ML IJ SOLN
0.0000 [IU] | Freq: Every day | INTRAMUSCULAR | Status: DC
  Administered 2024-07-14 (×2): 2 [IU] via SUBCUTANEOUS
  Administered 2024-07-15: 3 [IU] via SUBCUTANEOUS
  Filled 2024-07-13 (×2): qty 1

## 2024-07-13 MED ORDER — INSULIN GLARGINE-YFGN 100 UNIT/ML ~~LOC~~ SOLN
5.0000 [IU] | Freq: Every day | SUBCUTANEOUS | Status: DC
  Administered 2024-07-14: 5 [IU] via SUBCUTANEOUS
  Filled 2024-07-13 (×2): qty 0.05

## 2024-07-13 MED ORDER — ENOXAPARIN SODIUM 30 MG/0.3ML IJ SOSY
30.0000 mg | PREFILLED_SYRINGE | INTRAMUSCULAR | Status: DC
  Administered 2024-07-13 – 2024-07-15 (×3): 30 mg via SUBCUTANEOUS
  Filled 2024-07-13 (×3): qty 0.3

## 2024-07-13 MED ORDER — INSULIN ASPART 100 UNIT/ML IJ SOLN
0.0000 [IU] | Freq: Three times a day (TID) | INTRAMUSCULAR | Status: DC
  Administered 2024-07-14 (×2): 5 [IU] via SUBCUTANEOUS
  Administered 2024-07-14: 3 [IU] via SUBCUTANEOUS
  Administered 2024-07-15: 2 [IU] via SUBCUTANEOUS
  Administered 2024-07-15: 5 [IU] via SUBCUTANEOUS
  Administered 2024-07-15: 7 [IU] via SUBCUTANEOUS
  Administered 2024-07-16: 5 [IU] via SUBCUTANEOUS
  Administered 2024-07-16: 3 [IU] via SUBCUTANEOUS
  Filled 2024-07-13 (×9): qty 1

## 2024-07-13 MED ORDER — AMLODIPINE BESYLATE 5 MG PO TABS
5.0000 mg | ORAL_TABLET | Freq: Every day | ORAL | Status: DC
  Administered 2024-07-14 – 2024-07-16 (×3): 5 mg via ORAL
  Filled 2024-07-13 (×3): qty 1

## 2024-07-13 MED ORDER — SODIUM BICARBONATE 8.4 % IV SOLN
50.0000 meq | Freq: Once | INTRAVENOUS | Status: AC
  Administered 2024-07-13: 50 meq via INTRAVENOUS
  Filled 2024-07-13: qty 50

## 2024-07-13 MED ORDER — DEXTROSE 50 % IV SOLN
1.0000 | Freq: Once | INTRAVENOUS | Status: AC
Start: 2024-07-13 — End: 2024-07-13
  Administered 2024-07-13: 50 mL via INTRAVENOUS
  Filled 2024-07-13: qty 50

## 2024-07-13 MED ORDER — SODIUM CHLORIDE 0.9 % IV SOLN
1.0000 g | Freq: Once | INTRAVENOUS | Status: AC
  Administered 2024-07-13: 1 g via INTRAVENOUS
  Filled 2024-07-13: qty 10

## 2024-07-13 MED ORDER — ACETAMINOPHEN 325 MG PO TABS: 650.0000 mg | ORAL_TABLET | Freq: Four times a day (QID) | ORAL | Status: DC | PRN

## 2024-07-13 MED ORDER — INSULIN ASPART 100 UNIT/ML IV SOLN
5.0000 [IU] | Freq: Once | INTRAVENOUS | Status: AC
  Administered 2024-07-13: 5 [IU] via INTRAVENOUS
  Filled 2024-07-13: qty 1

## 2024-07-13 MED ORDER — ONDANSETRON HCL 4 MG PO TABS: 4.0000 mg | ORAL_TABLET | Freq: Four times a day (QID) | ORAL | Status: DC | PRN

## 2024-07-13 MED ORDER — LACTATED RINGERS IV BOLUS
1000.0000 mL | Freq: Once | INTRAVENOUS | Status: AC
  Administered 2024-07-13: 1000 mL via INTRAVENOUS

## 2024-07-13 MED ORDER — CALCIUM GLUCONATE-NACL 1-0.675 GM/50ML-% IV SOLN
1.0000 g | Freq: Once | INTRAVENOUS | Status: AC
  Administered 2024-07-13: 1000 mg via INTRAVENOUS
  Filled 2024-07-13: qty 50

## 2024-07-13 MED ORDER — ONDANSETRON HCL 4 MG/2ML IJ SOLN: 4.0000 mg | Freq: Four times a day (QID) | INTRAMUSCULAR | Status: DC | PRN

## 2024-07-13 MED ORDER — LACTATED RINGERS IV SOLN: INTRAVENOUS | Status: DC

## 2024-07-13 MED ORDER — ACETAMINOPHEN 650 MG RE SUPP: 650.0000 mg | Freq: Four times a day (QID) | RECTAL | Status: DC | PRN

## 2024-07-13 MED ORDER — PRAVASTATIN SODIUM 10 MG PO TABS
10.0000 mg | ORAL_TABLET | Freq: Every day | ORAL | Status: DC
  Administered 2024-07-14 – 2024-07-16 (×3): 10 mg via ORAL
  Filled 2024-07-13 (×3): qty 1

## 2024-07-13 MED ORDER — SODIUM ZIRCONIUM CYCLOSILICATE 10 G PO PACK
10.0000 g | PACK | Freq: Once | ORAL | Status: AC
  Administered 2024-07-13: 10 g via ORAL
  Filled 2024-07-13: qty 1

## 2024-07-13 MED ORDER — SODIUM CHLORIDE 0.9 % IV SOLN
1.0000 g | INTRAVENOUS | Status: DC
  Administered 2024-07-14 – 2024-07-16 (×3): 1 g via INTRAVENOUS
  Filled 2024-07-13 (×3): qty 10

## 2024-07-13 NOTE — H&P (Incomplete)
 History and Physical    Patient: Jennifer Anthony FMW:[id number] DOB: 07/03/1941 DOA: 07/13/2024 DOS: the patient was seen and examined on 07/13/2024 PCP: [name], MD  Patient coming from: Home  Chief Complaint:  Chief Complaint  Patient presents with   Weakness   HPI: Jennifer Anthony is a 83 y.o. female with medical history significant of hypertension, T2DM, CKD stage III, normocytic anemia, monoclonal gammopathy who presents to the emergency department due to 1 week of right-sided flank pain and abdominal pain.  She complained of 1 day onset of weakness which was associated with nausea without vomiting as well as decreased appetite.  Patient denies burning sensation on urination or any other irritative bowel symptoms.  She endorsed history of chronic diarrhea, but denies chest pain, shortness of breath, fever, chills.  ED course In the emergency department, BP was 140/59, other vital signs were within normal range.  Workup in the ED showed normocytic anemia.  BMP 135, potassium 5.9, chloride 114, bicarb 12, blood glucose 221, BUN 50, creatinine 1.90, calcium 12.3, ALP 141.  Urinalysis was positive for large leukocytes and >50 WBCs. CT abdomen and pelvis without contrast showed no hydronephrosis or nephrolithiasis.  No evidence of bowel obstruction. Patient was treated with IV ceftriaxone due to presumed UTI.  IV LR 1 L was given.  TRH was asked to admit patient    Review of Systems: As mentioned in the history of present illness. All other systems reviewed and are negative. Past Medical History:  Diagnosis Date   Adenocarcinoma of colon (HCC)    CKD (chronic kidney disease), stage III (HCC)    Diabetes mellitus    Diabetes mellitus, type II (HCC)    Hyperkalemia    Hypertension    Hypertension    Osteoarthritis    Past Surgical History:  Procedure Laterality Date   ABDOMINAL HYSTERECTOMY     ANKLE FRACTURE SURGERY     CATARACT EXTRACTION, BILATERAL     COLON SURGERY      Social History:  reports that she quit smoking about 25 years ago. Her smoking use included cigarettes. She started smoking about 45 years ago. She has a 10 pack-year smoking history. She has never used smokeless tobacco. She reports that she does not drink alcohol and does not use drugs.  Allergies  Allergen Reactions   Ampicillin Swelling   Clindamycin/Lincomycin     Family History  Problem Relation Age of Onset   Stroke Mother    Heart failure Mother    Cancer Father     Prior to Admission medications   Medication Sig Start Date End Date Taking? Authorizing Provider  amLODipine (NORVASC) 5 MG tablet Take 5 mg by mouth daily. 06/26/24   [provider]  aspirin 81 MG tablet Take 81 mg by mouth daily.      [provider]  Cholecalciferol (VITAMIN D-3) 25 MCG (1000 UT) CAPS Take 1 capsule by mouth daily.    [provider]  Continuous Glucose Sensor (DEXCOM G7 SENSOR) MISC SMARTSIG:1 Every 10 Days 01/13/24   [provider]  FARXIGA 10 MG TABS tablet Take 10 mg by mouth daily.    [provider]  insulin detemir (LEVEMIR) 100 UNIT/ML injection Inject into the skin. 12/15/16   [provider]  insulin lispro (HUMALOG) 100 UNIT/ML KwikPen Inject 10 Units into the skin 3 (three) times daily before meals.     [provider]  LANTUS SOLOSTAR 100 UNIT/ML Solostar Pen Inject 14 Units  into the skin at bedtime. 01/04/23   [provider]  [facility] ULTRA test strip 1 each daily.    [provider]  pravastatin (PRAVACHOL) 10 MG tablet Take 10 mg by mouth daily. 12/07/18   [provider]  VELTASSA 8.4 g packet daily. 12/06/18   [provider]    Physical Exam: Vitals:   07/13/24 1836 07/13/24 1915 07/13/24 1930 07/13/24 1945  BP:  (!) 147/84 (!) 165/96 (!) 155/99  Pulse:  67 71 74  Resp:  19 18 12   Temp: (!) 97.5 F (36.4 C)     TempSrc: Oral     SpO2:  98% 100% 98%  Weight:      Height:        General: Elderly female. Awake and alert and oriented x3. Not in any acute distress.  HEENT: NCAT.  PERRLA. EOMI. Sclerae anicteric.  Dry mucosal membranes. Neck: Neck supple without lymphadenopathy. No carotid bruits. No masses palpated.  Cardiovascular: Regular rate with normal S1-S2 sounds. No murmurs, rubs or gallops auscultated. No JVD.  Respiratory: Clear breath sounds.  No accessory muscle use. Abdomen: Soft, nontender, nondistended. Active bowel sounds. No masses or hepatosplenomegaly  Skin: No rashes, lesions, or ulcerations.  Dry, warm to touch. Musculoskeletal:  2+ dorsalis pedis and radial pulses. Good ROM.  No contractures  Psychiatric: Intact judgment and insight.  Mood appropriate to current condition. Neurologic: No focal neurological deficits. Strength is 5/5 x 4.  CN II - XII grossly intact.  Data Reviewed: EKG personally reviewed showed normal sinus rhythm at rate of 66 bpmbut with ST elevation in inferior lateral leads.  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 Dehydration Creatinine 1.98, this was 1.62 about a month ago Continue gentle hydration Renally adjust medications, avoid nephrotoxic agents/dehydration/hypotension  Non-anion gap metabolic acidosis in the setting of above Bicarb 12, this may be due to patient's history of chronic diarrhea and dehydration Sodium bicarbonate 50 mEq x 1 was given Continue gentle hydration Isotonic bicarbonate will be considered if no improvement with rehydration  Presumed UTI POA Patient was empirically started on IV ceftriaxone, we shall continue same at this time with plan to de-escalate/discontinue based on urine culture  Hyperkalemia K+ 5.9, calcium gluconate will be given Insulin and D50 will be given Continue sodium bicarbonate Lokelma will be given.  Hypercalcemia Calcium 12.3; this was 11.3 a month ago Patient has a history of monoclonal  gammopathy, but myeloma labs and recent skeletal survey showed no lytic lesions per oncology medical record Continue IV hydration PTH will be checked  Abnormal EKG EKG personally reviewed showed normal sinus rhythm at a rate of 66 bpm, but with ST elevation in inferolateral leads.  No prior EKGs for comparison Patient denies any chest pain, orthopnea, dyspnea on exertion or any other cardiac symptoms Cardiologist on-call was consulted and recommended repeating EKG***  Type 2 diabetes mellitus with hyperglycemia Continue Semglee 5 units nightly (home dose of 14 units) and adjust dose accordingly Continue ISS and hypoglycemia protocol  Essential hypertension Continue amlodipine  Mixed hyperlipidemia Continue pravastatin   Advance Care Planning: Full code  Consults: None  Family Communication: None at bedside  Severity of Illness: The appropriate patient status for this patient is INPATIENT. Inpatient status is judged to be reasonable and necessary in order to provide the required intensity of service to ensure the patient's safety. The patient's presenting symptoms, physical exam findings, and initial radiographic and laboratory  data in the context of their chronic comorbidities is felt to place them at high risk for further clinical deterioration. Furthermore, it is not anticipated that the patient will be medically stable for discharge from the hospital within 2 midnights of admission.   * I certify that at the point of admission it is my clinical judgment that the patient will require inpatient hospital care spanning beyond 2 midnights from the point of admission due to high intensity of service, high risk for further deterioration and high frequency of surveillance required.*  Author: Conal Shetley, DO 07/13/2024 7:57 PM  For on call review [email or web address].

## 2024-07-13 NOTE — ED Triage Notes (Signed)
 Pt complains of right flank pain and abd pain and decreased appetitie all week. Denies any vomiting. Denies any fever, normal BM this week and no Urinary symptoms

## 2024-07-13 NOTE — ED Provider Notes (Signed)
 [place] EMERGENCY DEPARTMENT AT [facility] Provider Note  CSN: [id number] Arrival date & time: 07/13/24 1357  Chief Complaint(s) Weakness  HPI Jennifer Anthony is a 83 y.o. female history of CKD, colon cancer, hypertension presenting to the emergency department with feeling weak.  Patient reports weakness since yesterday, reports associated nausea, decreased appetite.  Was also having right sided flank pain and abdominal pain though reports this is improved.  Denies any urinary symptoms.  No vomiting.  Reports some diarrhea which is chronic and unchanged.  No fevers or chills.  Denies similar episode.   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 Authorizing Provider  amLODipine (NORVASC) 5 MG tablet Take 5 mg by mouth daily. 06/26/24   [provider]  aspirin 81 MG tablet Take 81 mg by mouth daily.      [provider]  Cholecalciferol (VITAMIN D-3) 25 MCG (1000 UT) CAPS Take 1 capsule by mouth daily.    [provider]  Continuous Glucose Sensor (DEXCOM G7 SENSOR) MISC SMARTSIG:1 Every 10 Days 01/13/24   [provider]  FARXIGA 10 MG TABS tablet Take 10 mg by mouth daily.    [provider]  insulin detemir (LEVEMIR) 100 UNIT/ML injection Inject into the skin. 12/15/16   [provider]   insulin lispro (HUMALOG) 100 UNIT/ML KwikPen Inject 10 Units into the skin 3 (three) times daily before meals.     [provider]  LANTUS SOLOSTAR 100 UNIT/ML Solostar Pen Inject 14 Units into the skin at bedtime. 01/04/23   [provider]  [facility] ULTRA test strip 1 each daily.    [provider]  pravastatin (PRAVACHOL) 10 MG tablet Take 10 mg by mouth daily. 12/07/18   [provider]  VELTASSA 8.4 g packet daily. 12/06/18   [provider]                                                                                                                                    Past Surgical History Past Surgical History:  Procedure Laterality Date   ABDOMINAL HYSTERECTOMY     ANKLE FRACTURE SURGERY     CATARACT EXTRACTION, BILATERAL     COLON SURGERY     Family History  Family History  Problem Relation Age of Onset   Stroke Mother    Heart failure Mother    Cancer Father     Social History Social History   Tobacco Use   Smoking status: Former    Current packs/day: 0.00    Average packs/day: 0.5 packs/day for 20.0 years (10.0 ttl pk-yrs)    Types: Cigarettes    Start date: 63    Quit date: 2000    Years since quitting: 25.8   Smokeless tobacco: Never  Vaping Use   Vaping status: Never Used  Substance Use Topics   Alcohol use: No   Drug use: No   Allergies Ampicillin and Clindamycin/lincomycin  Review of Systems Review of Systems  All other systems reviewed and are negative.   Physical Exam Vital Signs  I have reviewed the triage vital signs BP (!) 153/101   Pulse 73   Temp (!) 97.5 F (36.4 C) (Oral)   Resp (!) 21   Ht 5' 6 (1.676 m)   Wt 69 kg   SpO2 97%   BMI 24.55 kg/m  Physical Exam Vitals and nursing note reviewed.  Constitutional:      General: She is not in acute distress.    Appearance: She is well-developed.  HENT:     Head: Normocephalic and atraumatic.     Mouth/Throat:     Mouth: Mucous membranes  are moist.  Eyes:     Pupils: Pupils are equal, round, and reactive to light.  Cardiovascular:     Rate and Rhythm: Normal rate and regular rhythm.     Heart sounds: No murmur heard. Pulmonary:     Effort: Pulmonary effort is normal. No respiratory distress.     Breath sounds: Normal breath sounds.  Abdominal:     General: Abdomen is flat.     Palpations: Abdomen is soft.     Tenderness: There is no abdominal tenderness. There is right CVA tenderness.  Musculoskeletal:        General: No tenderness.     Right lower leg: No edema.     Left lower leg: No edema.  Skin:    General: Skin is warm and dry.  Neurological:     General: No focal deficit present.     Mental Status: She is alert. Mental status is at baseline.  Psychiatric:        Mood and Affect: Mood normal.        Behavior: Behavior normal.     ED Results and Treatments Labs (all labs ordered are listed, but only abnormal results are displayed) Labs Reviewed  CBC - Abnormal; Notable for the following components:      Result Value   Hemoglobin 10.3 (*)    HCT 34.5 (*)    MCHC 29.9 (*)    All other components within normal limits  URINALYSIS, ROUTINE W REFLEX MICROSCOPIC - Abnormal; Notable for the following components:   APPearance CLOUDY (*)    Protein, ur 30 (*)    Leukocytes,Ua LARGE (*)    Bacteria, UA FEW (*)    All other components within normal limits  COMPREHENSIVE METABOLIC PANEL WITH GFR - Abnormal; Notable for the following components:   Potassium 5.9 (*)    Chloride 114 (*)    CO2 12 (*)    Glucose, Bld 221 (*)    BUN 50 (*)    Creatinine, Ser 1.98 (*)    Calcium 12.3 (*)    AST 13 (*)  Alkaline Phosphatase 141 (*)    GFR, Estimated 24 (*)    All other components within normal limits  BLOOD GAS, VENOUS - Abnormal; Notable for the following components:   pH, Ven 7.23 (*)    pCO2, Ven 33 (*)    pO2, Ven <31 (*)    Bicarbonate 13.9 (*)    Acid-base deficit 12.8 (*)    All other components  within normal limits  CBG MONITORING, ED - Abnormal; Notable for the following components:   Glucose-Capillary 163 (*)    All other components within normal limits  CBG MONITORING, ED - Abnormal; Notable for the following components:   Glucose-Capillary 144 (*)    All other components within normal limits  LIPASE, BLOOD                                                                                                                          Radiology CT Renal Stone Study Result Date: 07/13/2024 CLINICAL DATA:  Abdominal pain. EXAM: CT ABDOMEN AND PELVIS WITHOUT CONTRAST TECHNIQUE: Multidetector CT imaging of the abdomen and pelvis was performed following the standard protocol without IV contrast. RADIATION DOSE REDUCTION: This exam was performed according to the departmental dose-optimization program which includes automated exposure control, adjustment of the mA and/or kV according to patient size and/or use of iterative reconstruction technique. COMPARISON:  None Available. FINDINGS: Evaluation of this exam is limited in the absence of intravenous contrast as well as due to respiratory motion. Lower chest: The visualized lung bases are clear. No intra-abdominal free air or free fluid. Hepatobiliary: The liver is grossly unremarkable. Dilated cystic structure in the right upper abdomen may represent the gallbladder or a dilated bowel loop. No calcified gallstone. Surgical clips in the upper abdomen may be related to cholecystectomy. An air pocket in the right upper abdomen may represent air within the biliary tree. Pancreas: The pancreas is suboptimally evaluated and appears atrophic. No active inflammatory changes. Spleen: Normal in size without focal abnormality. Adrenals/Urinary Tract: The adrenal glands unremarkable. There is no hydronephrosis on either side. There is a 6 cm right renal inferior pole cyst as well as a rim calcified lesion in the inferior pole of the left kidney. The urinary bladder  is grossly unremarkable. Stomach/Bowel: There is postsurgical changes of bowel with anastomotic staple line in the pelvis. No evidence of bowel obstruction. The appendix is not visualized with certainty. No inflammatory changes identified in the right lower quadrant. Vascular/Lymphatic: Advanced aortoiliac atherosclerotic disease. The IVC is unremarkable. No obvious adenopathy. Reproductive: The uterus is poorly visualized. Other: None Musculoskeletal: Osteopenia with degenerative changes of the spine. No acute osseous pathology. IMPRESSION: 1. Very limited exam due to severe respiratory motion and in the absence of intravenous contrast. No hydronephrosis or nephrolithiasis. 2. No evidence of bowel obstruction. 3.  Aortic Atherosclerosis (ICD10-I70.0). Electronically Signed   By: Vanetta Chou M.D.   On: 07/13/2024 19:14    Pertinent labs & imaging results that were available  during my care of the patient were reviewed by me and considered in my medical decision making (see MDM for details).  Medications Ordered in ED Medications  lactated ringers bolus 1,000 mL (0 mLs Intravenous Paused 07/13/24 1930)  cefTRIAXone (ROCEPHIN) 1 g in sodium chloride  0.9 % 100 mL IVPB (0 g Intravenous Stopped 07/13/24 2011)                                                                                                                                     Procedures Procedures  (including critical care time)  Medical Decision Making / ED Course   MDM:  83 year old presenting to the emergency department with weakness.  Patient overall well-appearing, vital signs reassuring.  Given flank pain, differential includes UTI, pyelonephritis, nephrolithiasis, other intra-abdominal process such as obstruction or perforation.  Labs are notable for possible AKI on CKD as well as some acidosis which appears worse than previous.  Patient also with mild hyperkalemia, will check EKG.  Suspect this likely related underlying  CKD.  Will give fluids and reassess.  Clinical Course as of 07/13/24 2035  Thu Jul 13, 2024  2033 Workup shows findings of pyelonephritis given CVA tenderness and UTI with WBC clumps.  Also with AKI.  VBG shows findings consistent with metabolic acidosis without anion gap possibly due to decreased renal function.  Discussed with hospitalist Dr. Manfred who will admit patient. [WS]    Clinical Course User Index [WS] Francesca Elsie CROME, MD     Additional history obtained:  -External records from outside source obtained and reviewed including: Chart review including previous notes, labs, imaging, consultation notes including prior notes    Lab Tests: -I ordered, reviewed, and interpreted labs.   The pertinent results include:   Labs Reviewed  CBC - Abnormal; Notable for the following components:      Result Value   Hemoglobin 10.3 (*)    HCT 34.5 (*)    MCHC 29.9 (*)    All other components within normal limits  URINALYSIS, ROUTINE W REFLEX MICROSCOPIC - Abnormal; Notable for the following components:   APPearance CLOUDY (*)    Protein, ur 30 (*)    Leukocytes,Ua LARGE (*)    Bacteria, UA FEW (*)    All other components within normal limits  COMPREHENSIVE METABOLIC PANEL WITH GFR - Abnormal; Notable for the following components:   Potassium 5.9 (*)    Chloride 114 (*)    CO2 12 (*)    Glucose, Bld 221 (*)    BUN 50 (*)    Creatinine, Ser 1.98 (*)    Calcium 12.3 (*)    AST 13 (*)    Alkaline Phosphatase 141 (*)    GFR, Estimated 24 (*)    All other components within normal limits  BLOOD GAS, VENOUS - Abnormal; Notable for the following components:   pH, Ven 7.23 (*)    pCO2, Ven 33 (*)  pO2, Ven <31 (*)    Bicarbonate 13.9 (*)    Acid-base deficit 12.8 (*)    All other components within normal limits  CBG MONITORING, ED - Abnormal; Notable for the following components:   Glucose-Capillary 163 (*)    All other components within normal limits  CBG MONITORING, ED -  Abnormal; Notable for the following components:   Glucose-Capillary 144 (*)    All other components within normal limits  LIPASE, BLOOD    Notable for see MDM  EKG   EKG Interpretation Date/Time:  Thursday July 13 2024 18:21:20 EST Ventricular Rate:  66 PR Interval:  176 QRS Duration:  78 QT Interval:  348 QTC Calculation: 365 R Axis:   74  Text Interpretation: Sinus rhythm Nonspecific ST abnormality Confirmed by Francesca Fallow ([id number]) on 07/13/2024 6:25:11 PM         Imaging Studies ordered: I ordered imaging studies including ct renal stone  On my interpretation imaging demonstrates no obstruction I independently visualized and interpreted imaging. I agree with the radiologist interpretation   Medicines ordered and prescription drug management: Meds ordered this encounter  Medications   lactated ringers bolus 1,000 mL   cefTRIAXone (ROCEPHIN) 1 g in sodium chloride  0.9 % 100 mL IVPB    Antibiotic Indication::   UTI    -I have reviewed the patients home medicines and have made adjustments as needed   Consultations Obtained: I requested consultation with the hospitalist,  and discussed lab and imaging findings as well as pertinent plan - they recommend: admission   Cardiac Monitoring: The patient was maintained on a cardiac monitor.  I personally viewed and interpreted the cardiac monitored which showed an underlying rhythm of: NSR  Social Determinants of Health:  Diagnosis or treatment significantly limited by social determinants of health: lives alone   Reevaluation: After the interventions noted above, I reevaluated the patient and found that their symptoms have improved  Co morbidities that complicate the patient evaluation  Past Medical History:  Diagnosis Date   Adenocarcinoma of colon (HCC)    CKD (chronic kidney disease), stage III (HCC)    Diabetes mellitus    Diabetes mellitus, type II (HCC)    Hyperkalemia    Hypertension     Hypertension    Osteoarthritis       Dispostion: Disposition decision including need for hospitalization was considered, and patient admitted to the hospital.    Final Clinical Impression(s) / ED Diagnoses Final diagnoses:  Pyelonephritis  Acute kidney injury superimposed on CKD  Metabolic acidosis, normal anion gap (NAG)     This chart was dictated using voice recognition software.  Despite best efforts to proofread,  errors can occur which can change the documentation meaning.    Francesca Fallow CROME, MD 07/13/24 2035

## 2024-07-14 ENCOUNTER — Inpatient Hospital Stay (HOSPITAL_COMMUNITY)

## 2024-07-14 ENCOUNTER — Other Ambulatory Visit (HOSPITAL_COMMUNITY): Payer: Self-pay | Admitting: *Deleted

## 2024-07-14 ENCOUNTER — Encounter (HOSPITAL_COMMUNITY): Payer: Self-pay | Admitting: Internal Medicine

## 2024-07-14 DIAGNOSIS — N1832 Chronic kidney disease, stage 3b: Secondary | ICD-10-CM

## 2024-07-14 DIAGNOSIS — N10 Acute tubulo-interstitial nephritis: Secondary | ICD-10-CM

## 2024-07-14 DIAGNOSIS — E1165 Type 2 diabetes mellitus with hyperglycemia: Secondary | ICD-10-CM

## 2024-07-14 DIAGNOSIS — N178 Other acute kidney failure: Secondary | ICD-10-CM

## 2024-07-14 DIAGNOSIS — R9431 Abnormal electrocardiogram [ECG] [EKG]: Secondary | ICD-10-CM

## 2024-07-14 DIAGNOSIS — E210 Primary hyperparathyroidism: Secondary | ICD-10-CM | POA: Insufficient documentation

## 2024-07-14 DIAGNOSIS — R7989 Other specified abnormal findings of blood chemistry: Secondary | ICD-10-CM

## 2024-07-14 LAB — CBC
HCT: 30.4 % — ABNORMAL LOW (ref 36.0–46.0)
Hemoglobin: 9.5 g/dL — ABNORMAL LOW (ref 12.0–15.0)
MCH: 27.1 pg (ref 26.0–34.0)
MCHC: 31.3 g/dL (ref 30.0–36.0)
MCV: 86.6 fL (ref 80.0–100.0)
Platelets: 223 K/uL (ref 150–400)
RBC: 3.51 MIL/uL — ABNORMAL LOW (ref 3.87–5.11)
RDW: 14.5 % (ref 11.5–15.5)
WBC: 5.5 K/uL (ref 4.0–10.5)
nRBC: 0 % (ref 0.0–0.2)

## 2024-07-14 LAB — GLUCOSE, CAPILLARY
Glucose-Capillary: 238 mg/dL — ABNORMAL HIGH (ref 70–99)
Glucose-Capillary: 218 mg/dL — ABNORMAL HIGH (ref 70–99)
Glucose-Capillary: 287 mg/dL — ABNORMAL HIGH (ref 70–99)
Glucose-Capillary: 276 mg/dL — ABNORMAL HIGH (ref 70–99)
Glucose-Capillary: 245 mg/dL — ABNORMAL HIGH (ref 70–99)

## 2024-07-14 LAB — URINALYSIS, W/ REFLEX TO CULTURE (INFECTION SUSPECTED)
Bilirubin Urine: NEGATIVE
Glucose, UA: 150 mg/dL — AB
Hgb urine dipstick: NEGATIVE
Ketones, ur: NEGATIVE mg/dL
Nitrite: NEGATIVE
Protein, ur: NEGATIVE mg/dL
Specific Gravity, Urine: 1.011 (ref 1.005–1.030)
WBC, UA: >50 WBC/hpf (ref 0–5)
pH: 5 (ref 5.0–8.0)

## 2024-07-14 LAB — ECHOCARDIOGRAM COMPLETE
Area-P 1/2: 1.6 cm2
Est EF: >75
Height: 66 in
S' Lateral: 1.9 cm
Weight: 2433.88 [oz_av]

## 2024-07-14 LAB — BASIC METABOLIC PANEL WITH GFR
Anion gap: 10 (ref 5–15)
Anion gap: 8 (ref 5–15)
BUN: 44 mg/dL — ABNORMAL HIGH (ref 8–23)
BUN: 44 mg/dL — ABNORMAL HIGH (ref 8–23)
CO2: 14 mmol/L — ABNORMAL LOW (ref 22–32)
CO2: 17 mmol/L — ABNORMAL LOW (ref 22–32)
Calcium: 11.4 mg/dL — ABNORMAL HIGH (ref 8.9–10.3)
Calcium: 11.7 mg/dL — ABNORMAL HIGH (ref 8.9–10.3)
Chloride: 113 mmol/L — ABNORMAL HIGH (ref 98–111)
Chloride: 110 mmol/L (ref 98–111)
Creatinine, Ser: 1.63 mg/dL — ABNORMAL HIGH (ref 0.44–1.00)
Creatinine, Ser: 1.48 mg/dL — ABNORMAL HIGH (ref 0.44–1.00)
GFR, Estimated: 31 mL/min — ABNORMAL LOW (ref >60)
GFR, Estimated: 34 mL/min — ABNORMAL LOW (ref >60)
Glucose, Bld: 246 mg/dL — ABNORMAL HIGH (ref 70–99)
Glucose, Bld: 302 mg/dL — ABNORMAL HIGH (ref 70–99)
Potassium: 5.4 mmol/L — ABNORMAL HIGH (ref 3.5–5.1)
Potassium: 5.3 mmol/L — ABNORMAL HIGH (ref 3.5–5.1)
Sodium: 136 mmol/L (ref 135–145)
Sodium: 134 mmol/L — ABNORMAL LOW (ref 135–145)

## 2024-07-14 LAB — COMPREHENSIVE METABOLIC PANEL WITH GFR
ALT: 11 U/L (ref 0–44)
AST: 12 U/L — ABNORMAL LOW (ref 15–41)
Albumin: 3.6 g/dL (ref 3.5–5.0)
Alkaline Phosphatase: 123 U/L (ref 38–126)
Anion gap: 8 (ref 5–15)
BUN: 47 mg/dL — ABNORMAL HIGH (ref 8–23)
CO2: 14 mmol/L — ABNORMAL LOW (ref 22–32)
Calcium: 11.9 mg/dL — ABNORMAL HIGH (ref 8.9–10.3)
Chloride: 114 mmol/L — ABNORMAL HIGH (ref 98–111)
Creatinine, Ser: 1.57 mg/dL — ABNORMAL HIGH (ref 0.44–1.00)
GFR, Estimated: 32 mL/min — ABNORMAL LOW (ref >60)
Glucose, Bld: 241 mg/dL — ABNORMAL HIGH (ref 70–99)
Potassium: 5.6 mmol/L — ABNORMAL HIGH (ref 3.5–5.1)
Sodium: 136 mmol/L (ref 135–145)
Total Bilirubin: 0.2 mg/dL (ref 0.0–1.2)
Total Protein: 6.1 g/dL — ABNORMAL LOW (ref 6.5–8.1)

## 2024-07-14 LAB — HEMOGLOBIN A1C
Hgb A1c MFr Bld: 6.6 % — ABNORMAL HIGH (ref 4.8–5.6)
Mean Plasma Glucose: 142.72 mg/dL

## 2024-07-14 LAB — VITAMIN B12: Vitamin B-12: 1209 pg/mL — ABNORMAL HIGH (ref 180–914)

## 2024-07-14 LAB — T4, FREE: Free T4: 0.8 ng/dL (ref 0.61–1.12)

## 2024-07-14 LAB — TROPONIN T, HIGH SENSITIVITY
Troponin T High Sensitivity: 124 ng/L (ref 0–19)
Troponin T High Sensitivity: 128 ng/L (ref 0–19)
Troponin T High Sensitivity: 154 ng/L (ref 0–19)
Troponin T High Sensitivity: 159 ng/L (ref 0–19)

## 2024-07-14 LAB — TSH: TSH: 0.875 u[IU]/mL (ref 0.350–4.500)

## 2024-07-14 LAB — POTASSIUM: Potassium: 5.6 mmol/L — ABNORMAL HIGH (ref 3.5–5.1)

## 2024-07-14 LAB — MAGNESIUM: Magnesium: 2.1 mg/dL (ref 1.7–2.4)

## 2024-07-14 LAB — PHOSPHORUS: Phosphorus: 2.5 mg/dL (ref 2.5–4.6)

## 2024-07-14 LAB — FOLATE: Folate: 14.4 ng/mL (ref >5.9)

## 2024-07-14 MED ORDER — SODIUM BICARBONATE 650 MG PO TABS
1300.0000 mg | ORAL_TABLET | Freq: Two times a day (BID) | ORAL | Status: DC
  Administered 2024-07-14 – 2024-07-16 (×5): 1300 mg via ORAL
  Filled 2024-07-14 (×5): qty 2

## 2024-07-14 MED ORDER — SODIUM CHLORIDE 0.9 % IV SOLN: INTRAVENOUS | Status: AC

## 2024-07-14 MED ORDER — SODIUM ZIRCONIUM CYCLOSILICATE 10 G PO PACK
10.0000 g | PACK | Freq: Once | ORAL | Status: AC
  Administered 2024-07-14: 10 g via ORAL
  Filled 2024-07-14: qty 1

## 2024-07-14 MED ORDER — LACTATED RINGERS IV BOLUS
500.0000 mL | Freq: Once | INTRAVENOUS | Status: AC
  Administered 2024-07-14: 500 mL via INTRAVENOUS

## 2024-07-14 NOTE — Progress Notes (Signed)
 Mobility Specialist Progress Note:    07/14/24 1340  Mobility  Activity Pivoted/transferred from chair to bed  Level of Assistance Minimal assist, patient does 75% or more  Assistive Device None  Distance Ambulated (ft) 3 ft  Range of Motion/Exercises Active;All extremities  Activity Response Tolerated well  Mobility Referral Yes  Mobility visit 1 Mobility  Mobility Specialist Start Time (ACUTE ONLY) 1340  Mobility Specialist Stop Time (ACUTE ONLY) 1400  Mobility Specialist Time Calculation (min) (ACUTE ONLY) 20 min   Pt received in chair, echo requesting assistance transferring to bed. Required MinA with no AD to stand and transfer. Tolerated well, asx throughout. Echo in room, all needs met.  Amairani Shuey Mobility Specialist Please contact via Special Educational Needs Teacher or  Rehab office at [phone]

## 2024-07-14 NOTE — Plan of Care (Signed)
   Problem: Education: Goal: Knowledge of General Education information will improve Description Including pain rating scale, medication(s)/side effects and non-pharmacologic comfort measures Outcome: Progressing

## 2024-07-14 NOTE — TOC Initial Note (Signed)
 Transition of Care [facility]) - Initial/Assessment Note    Patient Details  Name: Jennifer Anthony MRN: [id number] Date of Birth: [birth year]  [facility] CM/SW Contact:    Lucie Lunger, LCSWA Phone Number: 07/14/2024, 11:51 AM  Clinical Narrative:                 CSW updated that PT is recommending HH PT for pt at D/C. CSW met with pt at bedside to review recommendations. Pt lives alone and is independent in completing her ADLs and drives when needed. Pt has a cane to use when needed. Pt states that she is agreeable to [facility] being arranged and does not have an agency preference. CSW spoke to Cory with [facility] who states they are able to accept [facility] PT referral at this time. TOC to follow.   Expected Discharge Plan: Home w Home Health Services Barriers to Discharge: Continued Medical Work up   Patient Goals and CMS Choice Patient states their goals for this hospitalization and ongoing recovery are:: Return home CMS Medicare.gov Compare Post Acute Care list provided to:: Patient Choice offered to / list presented to : Patient      Expected Discharge Plan and Services In-house Referral: Clinical Social Work Discharge Planning Services: CM Consult Post Acute Care Choice: Home Health Living arrangements for the past 2 months: Single Family Home                           HH Arranged: PT          Prior Living Arrangements/Services Living arrangements for the past 2 months: Single Family Home Lives with:: Self Patient language and need for interpreter reviewed:: Yes Do you feel safe going back to the place where you live?: Yes      Need for Family Participation in Patient Care: Yes (Comment) Care giver support system in place?: Yes (comment) Current home services: DME [facility]) Criminal Activity/Legal Involvement Pertinent to Current Situation/Hospitalization: No - Comment as needed  Activities of Daily Living   ADL Screening (condition at time of  admission) Independently performs ADLs?: Yes (appropriate for developmental age) Is the patient deaf or have difficulty hearing?: No Does the patient have difficulty seeing, even when wearing glasses/contacts?: Yes Does the patient have difficulty concentrating, remembering, or making decisions?: No  Permission Sought/Granted                  Emotional Assessment Appearance:: Appears stated age Attitude/Demeanor/Rapport: Engaged Affect (typically observed): Accepting Orientation: : Oriented to Self, Oriented to Place, Oriented to  Time, Oriented to Situation Alcohol / Substance Use: Not Applicable Psych Involvement: No (comment)  Admission diagnosis:  Metabolic acidosis, normal anion gap (NAG) [E87.20] UTI (urinary tract infection) [N39.0] Pyelonephritis [N12] Acute kidney injury superimposed on CKD [N17.9, N18.9] Patient Active Problem List   Diagnosis Date Noted   Acute renal failure superimposed on stage 3b chronic kidney disease (HCC) 07/14/2024   Uncontrolled type 2 diabetes mellitus with hyperglycemia, with long-term current use of insulin (HCC) 07/14/2024   Acute pyelonephritis 07/14/2024   Hypercalcemia 07/14/2024   Primary hyperparathyroidism 07/14/2024   UTI (urinary tract infection) 07/13/2024   Iron  deficiency anemia 03/20/2024   Diabetes mellitus with stage 3 chronic kidney disease (HCC) 03/02/2023   Hyperparathyroidism 01/10/2019   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 & [address] [place] [address] Phone: [phone] Fax: [phone]     Social Drivers of Health (SDOH) Social History: SDOH Screenings   Food Insecurity: No Food Insecurity (07/13/2024)  Housing: Low Risk  (07/13/2024)  Transportation Needs: No Transportation Needs (07/13/2024)  Utilities: Not At Risk  (07/13/2024)  Depression (PHQ2-9): Low Risk  (07/05/2024)  Financial Resource Strain: Low Risk (08/10/2022)   [facility] from [facility]  Social Connections: Moderately Integrated (07/13/2024)  Tobacco Use: Medium Risk (07/14/2024)  Health Literacy: High Risk (04/28/2021)   Received from [facility]   SDOH Interventions:     Readmission Risk Interventions     No data to display

## 2024-07-14 NOTE — Care Management Important Message (Signed)
 Important Message  Patient Details  Name: Jennifer Anthony MRN: [id number] Date of Birth: 08/24/1941   Important Message Given:  Yes - Medicare IM     Delayza Lungren L Shoichi Mielke 07/14/2024, 11:32 AM

## 2024-07-14 NOTE — Progress Notes (Signed)
 PROGRESS NOTE  Jennifer MCGUIRT FMW:[id number] DOB: 04-19-1941 DOA: 07/13/2024 PCP: [name], MD  Brief History:  83 year old female with a history of diabetes mellitus type 2, hypertension, CKD stage III, MGUS, and primary hyperparathyroidism presenting with generalized weakness, abdominal pain.  The patient states that she has been feeling generally unwell for about a week.  However her symptoms have progressed with worsening generalized weakness and nausea and vomiting on 07/13/2024.  She denies fevers, chills, chest pain, shortness breath, coughing, hemoptysis.  There is no hematochezia or melena.  She denies any dysuria or hematuria. The patient shows me AVS paperwork from 07/05/2024 that shows that one of her outpatient providers stopped Sensipar  and olmesartan.  She states that she lives by herself.  She drove to the emergency department on her own by private vehicle to seek medical care.  She does not use any assistive devices and is independent with her ADLs at baseline.  In the ED, the patient was afebrile hemodynamically stable with oxygen  saturation 100% room air.  WBC 5.3, hemoglobin 10.3, platelet 234.  Sodium 136, potassium 5.6, bicarbonate 14, serum creatinine 1.98.  LFTs were unremarkable.  Corrected calcium 12.4.  UA showed  >50 WBC. the patient was started IV fluids and ceftriaxone..    Assessment/Plan: Acute on chronic renal failure--CKD stage IIIb - Baseline creatinine 1.4-1.6 - Patient presented with serum creatinine 1.98 - Secondary to volume depletion in the setting of hypercalcemia and poor oral intake - Continue IV fluids>> improving  Hypercalcemia - The patient last saw Dr. 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  MGUS - Patient last followed up with Dr. Rogers 03/20/2024 - At that time myeloma labs from 03/08/2024: M spike is 0.4 g.  FLC ratio is 3.07.  Immunofixation shows biclonal MGUS. -  skeletal survey from 03/08/2024: No lytic lesions. - She will need outpatient follow-up with hematology  Mixed hyperlipidemia - Continue statin  Essential hypertension - Continue amlodipine        Family Communication:   no Family at bedside  Consultants:  none  Code Status:  FULL   DVT Prophylaxis:  [place] Lovenox   Procedures: As Listed in Progress Note Above  Antibiotics: Ceftriaxone 11/13>>        Subjective:  She complains of generalized weakness.  She states her abdominal pain is low but better.  She denies any fever, chills, chest pain, shortness breath, nausea, vomiting, hematochezia, melena.  She had 1 loose stool yesterday. Objective: Vitals:  07/13/24 2050 07/13/24 2146 07/14/24 0105 07/14/24 0515  BP: (!) 160/65  (!) 140/66 (!) 141/55  Pulse: 80  71 71  Resp: 18  17 18   Temp: 98.2 F (36.8 C)  98.3 F (36.8 C) 98.6 F (37 C)  TempSrc: Oral     SpO2: 97% 97% 99% 100%  Weight:      Height:         Intake/Output Summary (Last 24 hours) at 07/14/2024 9176 Last data filed at 07/14/2024 0500 Gross per 24 hour  Intake 100.24 ml  Output 200 ml  Net -99.76 ml   Weight change:  Exam:  General:  Pt is alert, follows commands appropriately, not in acute distress HEENT: No icterus, No thrush, No neck mass, [place]/AT Cardiovascular: RRR, S1/S2, no rubs, no gallops Respiratory: CTA bilaterally, no wheezing, no crackles, no rhonchi Abdomen: Soft/+BS, non tender, non distended, no guarding Extremities: No edema, No lymphangitis, No petechiae, No rashes, no synovitis   Data Reviewed: I have personally reviewed following labs and imaging studies Basic Metabolic Panel: Recent Labs  Lab 07/13/24 1433 07/13/24 2107 07/14/24 0111  NA 135 138 136  K 5.9* 5.9* 5.6*  5.6*  CL 114* 116* 114*  CO2 12* 13* 14*  GLUCOSE 221* 159* 241*  BUN 50* 49* 47*  CREATININE 1.98* 1.75* 1.57*  CALCIUM 12.3* 12.4* 11.9*  MG  --   --  2.1  PHOS  --   --  2.5   Liver Function Tests: Recent Labs  Lab 07/13/24 1433 07/14/24 0111  AST 13* 12*  ALT 13 11  ALKPHOS 141* 123  BILITOT 0.3 0.2  PROT 7.1 6.1*  ALBUMIN 4.1 3.6   Recent Labs  Lab 07/13/24 1433  LIPASE 22   No results for input(s): AMMONIA in the last 168 hours. Coagulation Profile: No results for input(s): INR, PROTIME in the last 168 hours. CBC: Recent Labs  Lab 07/13/24 1433 07/14/24 0111  WBC 5.3 5.5  HGB 10.3* 9.5*  HCT 34.5* 30.4*  MCV 89.1 86.6  PLT 234 223   Cardiac Enzymes: No results for input(s): CKTOTAL, CKMB, CKMBINDEX, TROPONINI in the last 168 hours. BNP: Invalid input(s): POCBNP CBG: Recent Labs  Lab 07/13/24 1823 07/13/24 2128 07/13/24 2308 07/14/24 0201 07/14/24 0713  GLUCAP 144* 161* 274* 238* 218*   HbA1C: No results for input(s): HGBA1C in the last 72 hours. Urine analysis:    Component Value Date/Time   COLORURINE YELLOW 07/13/2024 2335   APPEARANCEUR HAZY (A)  07/13/2024 2335   LABSPEC 1.011 07/13/2024 2335   PHURINE 5.0 07/13/2024 2335   GLUCOSEU 150 (A) 07/13/2024 2335   HGBUR NEGATIVE 07/13/2024 2335   BILIRUBINUR NEGATIVE 07/13/2024 2335   KETONESUR NEGATIVE 07/13/2024 2335   PROTEINUR NEGATIVE 07/13/2024 2335   NITRITE NEGATIVE 07/13/2024 2335   LEUKOCYTESUR LARGE (A) 07/13/2024 2335   Sepsis Labs: @LABRCNTIP (procalcitonin:4,lacticidven:4) )No results found for this or any previous visit (from the past 240 hours).   Scheduled Meds:  amLODipine  5 mg Oral Daily   enoxaparin (LOVENOX) injection  30 mg Subcutaneous Q24H   insulin aspart  0-5 Units Subcutaneous QHS   insulin aspart  0-9 Units Subcutaneous TID WC   insulin glargine-yfgn  5 Units Subcutaneous QHS   pravastatin  10 mg Oral Daily   sodium zirconium cyclosilicate  10 g Oral Once   Continuous Infusions:  cefTRIAXone (ROCEPHIN)  IV 1 g (07/14/24 0818)   lactated ringers 75 mL/hr at 07/14/24 0531    Procedures/Studies: CT  Renal Stone Study Result Date: 07/13/2024 CLINICAL DATA:  Abdominal pain. EXAM: CT ABDOMEN AND PELVIS WITHOUT CONTRAST TECHNIQUE: Multidetector CT imaging of the abdomen and pelvis was performed following the standard protocol without IV contrast. RADIATION DOSE REDUCTION: This exam was performed according to the departmental dose-optimization program which includes automated exposure control, adjustment of the mA and/or kV according to patient size and/or use of iterative reconstruction technique. COMPARISON:  None Available. FINDINGS: Evaluation of this exam is limited in the absence of intravenous contrast as well as due to respiratory motion. Lower chest: The visualized lung bases are clear. No intra-abdominal free air or free fluid. Hepatobiliary: The liver is grossly unremarkable. Dilated cystic structure in the right upper abdomen may represent the gallbladder or a dilated bowel loop. No calcified gallstone. Surgical clips in the upper abdomen may be  related to cholecystectomy. An air pocket in the right upper abdomen may represent air within the biliary tree. Pancreas: The pancreas is suboptimally evaluated and appears atrophic. No active inflammatory changes. Spleen: Normal in size without focal abnormality. Adrenals/Urinary Tract: The adrenal glands unremarkable. There is no hydronephrosis on either side. There is a 6 cm right renal inferior pole cyst as well as a rim calcified lesion in the inferior pole of the left kidney. The urinary bladder is grossly unremarkable. Stomach/Bowel: There is postsurgical changes of bowel with anastomotic staple line in the pelvis. No evidence of bowel obstruction. The appendix is not visualized with certainty. No inflammatory changes identified in the right lower quadrant. Vascular/Lymphatic: Advanced aortoiliac atherosclerotic disease. The IVC is unremarkable. No obvious adenopathy. Reproductive: The uterus is poorly visualized. Other: None Musculoskeletal: Osteopenia with degenerative changes of the spine. No acute osseous pathology. IMPRESSION: 1. Very limited exam due to severe respiratory motion and in the absence of intravenous contrast. No hydronephrosis or nephrolithiasis. 2. No evidence of bowel obstruction. 3.  Aortic Atherosclerosis (ICD10-I70.0). Electronically Signed   By: Vanetta Chou M.D.   On: 07/13/2024 19:14    Alm Schneider, DO  [facility]  If 7PM-7AM, please contact night-coverage [email or web address] Password [email or web address] 07/14/2024, 8:23 AM   LOS: 1 day

## 2024-07-14 NOTE — Evaluation (Addendum)
 Physical Therapy Evaluation Patient Details Name: Jennifer Anthony MRN: [id number] DOB: [birth year] Today's Date: 07/14/2024  History of Present Illness  Jennifer Anthony is a 83 y.o. female with medical history significant of hypertension, T2DM, CKD stage III, normocytic anemia, monoclonal gammopathy who presents to the emergency department due to 1 week of right-sided flank pain and abdominal pain.  She complained of 1 day onset of weakness which was associated with nausea without vomiting as well as decreased appetite.  Patient denies burning sensation on urination or any other irritative bowel symptoms.  She endorsed history of chronic diarrhea, but denies chest pain, shortness of breath, fever, chills.   Clinical Impression  Patient agreeable to PT evaluation. 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 Reports no falls. This date, patient is modified independent with bed mobility and requires CGA during transfers, including toilet, and ambulation with SPC. Patient is unsteady when ambulating, at times using wall and furniture to stabilize with other UE but does not exhibit any instances of overt LOB. Encouraged patient to consider use of RW from here. Patient is limited this date mostly due to R knee pain which is chronic in nature. Pt able to maintain good balance when washing hands at sink. Patient tolerates sitting in chair at end of session, call button in reach and all needs met. Patient reports feeling near baseline. Patient will benefit from continued skilled physical therapy acutely and in recommended setting in order to address current deficits and improve overall function.        If plan is discharge home, recommend the following: A little help with walking and/or transfers;Assistance with cooking/housework;Assist for transportation   Can travel by private vehicle        Equipment Recommendations None  recommended by PT  Recommendations for Other Services       Functional Status Assessment Patient has had a recent decline in their functional status and demonstrates the ability to make significant improvements in function in a reasonable and predictable amount of time.     Precautions / Restrictions Precautions Precautions: Fall Recall of Precautions/Restrictions: Intact Restrictions Weight Bearing Restrictions Per Provider Order: No      Mobility  Bed Mobility Overal bed mobility: Modified Independent             General bed mobility comments: HOB flat, no use of railings    Transfers Overall transfer level: Needs assistance Equipment used: Straight cane Transfers: Sit to/from Stand, Bed to chair/wheelchair/BSC Sit to Stand: Contact guard assist   Step pivot transfers: Contact guard assist       General transfer comment: CGA due to unsteadiness on feet, initial stand from bed without AD grabbing hold to arm rest for support, pt reporting chronic R knee pain and demo very short steps to chair with SPC still unsteady at times    Ambulation/Gait Ambulation/Gait assistance: Contact guard assist Gait Distance (Feet): 80 Feet Assistive device: Straight cane Gait Pattern/deviations: Step-through pattern, Decreased step length - right, Decreased step length - left, Decreased stride length, Trunk flexed, Drifts right/left Gait velocity: Dec     General Gait Details: Pt ambulates in room and hall with the above deviations and use of SPC and CGA due to unsteadiness on feet, reports of chronic R knee pain, at times patient uses wall or other furniture for support along with [facility] for stability  Stairs  Wheelchair Mobility     Tilt Bed    Modified Rankin (Stroke Patients Only)       Balance Overall balance assessment: Needs assistance Sitting-balance support: Feet supported, No upper extremity supported Sitting balance-Leahy Scale: Fair Sitting  balance - Comments: Seated EOB   Standing balance support: During functional activity, Reliant on assistive device for balance, Single extremity supported, Bilateral upper extremity supported Standing balance-Leahy Scale: Fair Standing balance comment: poor/fair W/ AD           Pertinent Vitals/Pain Pain Assessment Pain Assessment: No/denies pain    Home Living Family/patient expects to be discharged to:: Private residence Living Arrangements: Alone Available Help at Discharge: Family;Available PRN/intermittently Type of Home: House Home Access: Stairs to enter Entrance Stairs-Rails: Can reach both Entrance Stairs-Number of Steps: 2   Home Layout: One level Home Equipment: BSC/3in1;Rolling Walker (2 wheels);Cane - single point;Grab bars - toilet      Prior Function Prior Level of Function : Independent/Modified Independent;Driving       Mobility Comments: Reports as community ambulator wih SPC, reports no falls ADLs Comments: Independent     Extremity/Trunk Assessment   Upper Extremity Assessment Upper Extremity Assessment: Generalized weakness (shoulder AROM limited to ~120 deg, no pain with PROM, MMT 4/5, all bilaterally)    Lower Extremity Assessment Lower Extremity Assessment: Generalized weakness (ankle DF MMT 4+/5, hip flexion 4/5, all bilaterally, noted valgus in L foot and slightly in L knee)    Cervical / Trunk Assessment Cervical / Trunk Assessment: Kyphotic  Communication   Communication Communication: No apparent difficulties    Cognition Arousal: Alert Behavior During Therapy: WFL for tasks assessed/performed   PT - Cognitive impairments: No apparent impairments         Following commands: Intact       Cueing Cueing Techniques: Verbal cues, Visual cues, Gestural cues     General Comments      Exercises     Assessment/Plan    PT Assessment All further PT needs can be met in the next venue of care;Patient needs continued PT services   PT Problem List Decreased strength;Decreased range of motion;Decreased activity tolerance;Decreased balance;Decreased mobility;Pain       PT Treatment Interventions DME instruction;Gait training;Functional mobility training;Therapeutic activities;Therapeutic exercise;Stair training;Patient/family education;Balance training    PT Goals (Current goals can be found in the Care Plan section)  Acute Rehab PT Goals Patient Stated Goal: Return home PT Goal Formulation: With patient Time For Goal Achievement: 07/17/24 Potential to Achieve Goals: Good    Frequency Min 3X/week     Co-evaluation               AM-PAC PT 6 Clicks Mobility  Outcome Measure Help needed turning from your back to your side while in a flat bed without using bedrails?: None Help needed moving from lying on your back to sitting on the side of a flat bed without using bedrails?: None Help needed moving to and from a bed to a chair (including a wheelchair)?: A Little Help needed standing up from a chair using your arms (e.g., wheelchair or bedside chair)?: A Little Help needed to walk in hospital room?: A Little Help needed climbing 3-5 steps with a railing? : A Little 6 Click Score: 20    End of Session Equipment Utilized During Treatment: Gait belt Activity Tolerance: Patient tolerated treatment well;Patient limited by pain Patient left: in chair;with call bell/phone within reach Nurse Communication: Mobility status PT Visit Diagnosis: Unsteadiness on feet (R26.81);Pain;Difficulty in  walking, not elsewhere classified (R26.2);Muscle weakness (generalized) (M62.81) Pain - Right/Left: Right Pain - part of body: Knee    Time: 9086-9058 PT Time Calculation (min) (ACUTE ONLY): 28 min   Charges:   PT Evaluation $PT Eval Low Complexity: 1 Low   PT General Charges $$ ACUTE PT VISIT: 1 Visit         11:57 AM, 07/14/24 Rosaria Settler, PT, DPT [place] with [facility]

## 2024-07-14 NOTE — Hospital Course (Signed)
 83 year old female with a history of diabetes mellitus type 2, hypertension, CKD stage III, MGUS, and primary hyperparathyroidism presenting with generalized weakness, abdominal pain.  The patient states that she has been feeling generally unwell for about a week.  However her symptoms have progressed with worsening generalized weakness and nausea and vomiting on 07/13/2024.  She denies fevers, chills, chest pain, shortness breath, coughing, hemoptysis.  There is no hematochezia or melena.  She denies any dysuria or hematuria. The patient shows me AVS paperwork from 07/05/2024 that shows that one of her outpatient providers stopped Sensipar  and olmesartan.  She states that she lives by herself.  She drove to the emergency department on her own by private vehicle to seek medical care.  She does not use any assistive devices and is independent with her ADLs at baseline.  In the ED, the patient was afebrile hemodynamically stable with oxygen  saturation 100% room air.  WBC 5.3, hemoglobin 10.3, platelet 234.  Sodium 136, potassium 5.6, bicarbonate 14, serum creatinine 1.98.  LFTs were unremarkable.  Corrected calcium 12.4.  UA showed  >50 WBC. the patient was started IV fluids and ceftriaxone.SABRA

## 2024-07-14 NOTE — Plan of Care (Signed)
  Problem: Acute Rehab PT Goals(only PT should resolve) Goal: Pt Will Go Supine/Side To Sit Outcome: Progressing Flowsheets (Taken 07/14/2024 1159) Pt will go Supine/Side to Sit: Independently Goal: Patient Will Transfer Sit To/From Stand Outcome: Progressing Flowsheets (Taken 07/14/2024 1159) Patient will transfer sit to/from stand: with supervision Goal: Pt Will Transfer Bed To Chair/Chair To Bed Outcome: Progressing Flowsheets (Taken 07/14/2024 1159) Pt will Transfer Bed to Chair/Chair to Bed: with supervision Goal: Pt Will Ambulate Outcome: Progressing Flowsheets (Taken 07/14/2024 1159) Pt will Ambulate:  100 feet  with supervision  with least restrictive assistive device Goal: Pt Will Go Up/Down Stairs Outcome: Progressing Flowsheets (Taken 07/14/2024 1159) Pt will Go Up / Down Stairs:  1-2 stairs  with supervision  with rail(s)   12:00 PM, 07/14/24 Rosaria Settler, PT, DPT [place] with [facility]

## 2024-07-14 NOTE — TOC CM/SW Note (Signed)
 Transition of Care [facility]) - Inpatient Brief Assessment   Patient Details  Name: [name] MRN: [id number] Date of Birth: February 27, 1941  Transition of Care [facility]) CM/SW Contact:    Lucie Lunger, LCSWA Phone Number: 07/14/2024, 8:35 AM   Clinical Narrative: Transition of Care Department [facility]) has reviewed patient and no TOC needs have been identified at this time. We will continue to monitor patient advancement through interdiciplinary progression rounds. If new patient transition needs arise, please place a TOC consult.  Transition of Care Asessment: Insurance and Status: Insurance coverage has been reviewed Patient has primary care physician: Yes Home environment has been reviewed: From home Prior level of function:: Independent Prior/Current Home Services: No current home services Social Drivers of Health Review: SDOH reviewed no interventions necessary Readmission risk has been reviewed: Yes Transition of care needs: no transition of care needs at this time

## 2024-07-14 NOTE — Progress Notes (Signed)
 Patients K+ 5.9 on admission to the floor a STAT EKG was done 3x times and 2/3 showed STEMI Dr Manfred was notified and viewed the EKG. Was told this is most likely a Abnormality. Medication Action was taken to ensure lowering of K+. Upun redraw K+ still elevated critical Troponin Called in at 0235 and Dr manfred was notified. Patient currently getting 500 bolus of LR. Told to closely monitor te patient. Currently awaiting second Trop draw. Patient is not complaining of any discomfort or chest pain.

## 2024-07-14 NOTE — Progress Notes (Signed)
*  PRELIMINARY RESULTS* Echocardiogram 2D Echocardiogram has been performed.  Jennifer Anthony 07/14/2024, 3:15 PM

## 2024-07-14 NOTE — Progress Notes (Signed)
 Patient's second TROP came back Critical at 128 dr Manfred was notified of Critical Result. Patient still not complaining of Pain. LR at 75/hr ongoing.

## 2024-07-14 NOTE — Inpatient Diabetes Management (Signed)
 Inpatient Diabetes Program Recommendations  AACE/ADA: New Consensus Statement on Inpatient Glycemic Control (2015)  Target Ranges:  Prepandial:   less than 140 mg/dL      Peak postprandial:   less than 180 mg/dL (1-2 hours)      Critically ill patients:  140 - 180 mg/dL   Lab Results  Component Value Date   GLUCAP 287 (H) 07/14/2024   HGBA1C 6.6 (H) 07/14/2024    Latest Reference Range & Units 07/13/24 23:08 07/14/24 02:01 07/14/24 07:13 07/14/24 11:20  Glucose-Capillary 70 - 99 mg/dL 725 (H) 761 (H) 781 (H) 287 (H)  (H): Data is abnormally high Review of Glycemic Control  Diabetes history: DM2 Outpatient Diabetes medications: Lantus 16 units at HS, Humalog 7 units TID, Farxiga 10 mg daily Current orders for Inpatient glycemic control: Semglee 5 units at HS, Novolog 0-9 units correction scale TID, Novolog 0-5 units HS scale  Inpatient Diabetes Program Recommendations:   Noted that blood sugars have been greater than 180 mg/dl.  Noted that Semglee 5 units has not been started yet. Recommend increasing Semglee to 10 units at HS if blood sugars continue to be elevated.(Weight based 69 kg X 0.15 units/kg = 10.35 units)   Patient does take Lantus 16 units daily at home.  Marjorie Lunger RN BSN CDE Diabetes Coordinator Pager: [phone]  8am-5pm

## 2024-07-14 NOTE — Plan of Care (Signed)

## 2024-07-15 DIAGNOSIS — N10 Acute tubulo-interstitial nephritis: Secondary | ICD-10-CM | POA: Diagnosis not present

## 2024-07-15 DIAGNOSIS — N178 Other acute kidney failure: Secondary | ICD-10-CM | POA: Diagnosis not present

## 2024-07-15 DIAGNOSIS — E872 Acidosis, unspecified: Secondary | ICD-10-CM | POA: Diagnosis not present

## 2024-07-15 LAB — CBC
HCT: 27.1 % — ABNORMAL LOW (ref 36.0–46.0)
Hemoglobin: 8.3 g/dL — ABNORMAL LOW (ref 12.0–15.0)
MCH: 26.6 pg (ref 26.0–34.0)
MCHC: 30.6 g/dL (ref 30.0–36.0)
MCV: 86.9 fL (ref 80.0–100.0)
Platelets: 200 K/uL (ref 150–400)
RBC: 3.12 MIL/uL — ABNORMAL LOW (ref 3.87–5.11)
RDW: 14.5 % (ref 11.5–15.5)
WBC: 4.5 K/uL (ref 4.0–10.5)
nRBC: 0 % (ref 0.0–0.2)

## 2024-07-15 LAB — COMPREHENSIVE METABOLIC PANEL WITH GFR
ALT: 9 U/L (ref 0–44)
AST: <10 U/L — ABNORMAL LOW (ref 15–41)
Albumin: 3.2 g/dL — ABNORMAL LOW (ref 3.5–5.0)
Alkaline Phosphatase: 121 U/L (ref 38–126)
Anion gap: 8 (ref 5–15)
BUN: 38 mg/dL — ABNORMAL HIGH (ref 8–23)
CO2: 16 mmol/L — ABNORMAL LOW (ref 22–32)
Calcium: 10.4 mg/dL — ABNORMAL HIGH (ref 8.9–10.3)
Chloride: 112 mmol/L — ABNORMAL HIGH (ref 98–111)
Creatinine, Ser: 1.5 mg/dL — ABNORMAL HIGH (ref 0.44–1.00)
GFR, Estimated: 34 mL/min — ABNORMAL LOW (ref >60)
Glucose, Bld: 275 mg/dL — ABNORMAL HIGH (ref 70–99)
Potassium: 4.9 mmol/L (ref 3.5–5.1)
Sodium: 136 mmol/L (ref 135–145)
Total Bilirubin: <0.2 mg/dL (ref 0.0–1.2)
Total Protein: 5.4 g/dL — ABNORMAL LOW (ref 6.5–8.1)

## 2024-07-15 LAB — PARATHYROID HORMONE, INTACT (NO CA): PTH: 82 pg/mL — ABNORMAL HIGH (ref 15–65)

## 2024-07-15 LAB — GLUCOSE, CAPILLARY
Glucose-Capillary: 275 mg/dL — ABNORMAL HIGH (ref 70–99)
Glucose-Capillary: 177 mg/dL — ABNORMAL HIGH (ref 70–99)
Glucose-Capillary: 308 mg/dL — ABNORMAL HIGH (ref 70–99)
Glucose-Capillary: 267 mg/dL — ABNORMAL HIGH (ref 70–99)

## 2024-07-15 LAB — MAGNESIUM: Magnesium: 1.9 mg/dL (ref 1.7–2.4)

## 2024-07-15 MED ORDER — CINACALCET HCL 30 MG PO TABS
30.0000 mg | ORAL_TABLET | Freq: Every day | ORAL | Status: DC
  Administered 2024-07-15: 30 mg via ORAL
  Filled 2024-07-15: qty 1

## 2024-07-15 MED ORDER — SODIUM CHLORIDE 0.9 % IV SOLN: INTRAVENOUS | Status: AC

## 2024-07-15 MED ORDER — INSULIN ASPART 100 UNIT/ML IJ SOLN
3.0000 [IU] | Freq: Three times a day (TID) | INTRAMUSCULAR | Status: DC
  Administered 2024-07-16 (×2): 3 [IU] via SUBCUTANEOUS
  Filled 2024-07-15: qty 1

## 2024-07-15 MED ORDER — PATIROMER SORBITEX CALCIUM 8.4 G PO PACK
8.4000 g | PACK | Freq: Every day | ORAL | Status: DC
  Administered 2024-07-16: 8.4 g via ORAL
  Filled 2024-07-15 (×2): qty 1

## 2024-07-15 MED ORDER — INSULIN GLARGINE-YFGN 100 UNIT/ML ~~LOC~~ SOLN
10.0000 [IU] | Freq: Every day | SUBCUTANEOUS | Status: DC
  Administered 2024-07-15: 10 [IU] via SUBCUTANEOUS
  Filled 2024-07-15 (×2): qty 0.1

## 2024-07-15 NOTE — Progress Notes (Signed)
 PROGRESS NOTE  Jennifer Anthony FMW:[id number] DOB: 11/18/1940 DOA: 07/13/2024 PCP: [name], MD  Brief History:  83 year old female with a history of diabetes mellitus type 2, hypertension, CKD stage III, MGUS, and primary hyperparathyroidism presenting with generalized weakness, abdominal pain.  The patient states that she has been feeling generally unwell for about a week.  However her symptoms have progressed with worsening generalized weakness and nausea and vomiting on 07/13/2024.  She denies fevers, chills, chest pain, shortness breath, coughing, hemoptysis.  There is no hematochezia or melena.  She denies any dysuria or hematuria. The patient shows me AVS paperwork from 07/05/2024 that shows that one of her outpatient providers stopped Sensipar  and olmesartan.  She states that she lives by herself.  She drove to the emergency department on her own by private vehicle to seek medical care.  She does not use any assistive devices and is independent with her ADLs at baseline.  In the ED, the patient was afebrile hemodynamically stable with oxygen  saturation 100% room air.  WBC 5.3, hemoglobin 10.3, platelet 234.  Sodium 136, potassium 5.6, bicarbonate 14, serum creatinine 1.98.  LFTs were unremarkable.  Corrected calcium 12.4.  UA showed  >50 WBC. the patient was started IV fluids and ceftriaxone..    Assessment/Plan: Acute on chronic renal failure--CKD stage IIIb - Baseline creatinine 1.4-1.6 - Patient presented with serum creatinine 1.98 - Secondary to volume depletion in the setting of hypercalcemia and poor oral intake - Continue IV fluids>> improving   Hypercalcemia - The patient last saw Dr. 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 - Repeated Lokelma 11/14>>improved - Temporizing measures have been given - Monitor BMP - Continue IV fluids>>improved - pt is on veltassa daily at home>>restart   Diabetes mellitus type 2 with hyperglycemia - NovoLog sliding scale - Increase Semglee to 10 units - add novolog 3 units with meals - 11/14 hemoglobin A1c--6.6 - Holding Farxiga temporarily   NAGMA - 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   MGUS - Patient last followed up with Dr. Rogers 03/20/2024 - At that time myeloma labs from 03/08/2024: M spike is 0.4 g.  FLC ratio is 3.07.  Immunofixation shows biclonal MGUS. -  skeletal survey from 03/08/2024: No lytic lesions. - She will need outpatient follow-up with hematology   Mixed hyperlipidemia - Continue statin   Essential hypertension - Continue amlodipine               Family Communication:   cousin at bedside 11/15   Consultants:  none   Code Status:  FULL    DVT Prophylaxis:  [place] Lovenox     Procedures: As Listed in Progress Note  Above   Antibiotics: Ceftriaxone 11/13>>        Subjective: Patient denies fevers, chills, headache, chest pain, dyspnea, nausea, vomiting, diarrhea, abdominal pain, dysuria, hematuria, hematochezia, and melena.   Objective: Vitals:   07/14/24 1918 07/15/24 0030 07/15/24 0552 07/15/24 1348  BP: (!) 144/52 138/64 (!) 140/57  (!) 160/64  Pulse: 68 68 72 72  Resp: 16 18 18 18   Temp: 97.9 F (36.6 C) (!) 97.4 F (36.3 C) 98 F (36.7 C) 98.1 F (36.7 C)  TempSrc: Oral Oral Oral Oral  SpO2: 97% 99% 100% 98%  Weight:      Height:        Intake/Output Summary (Last 24 hours) at 07/15/2024 1724 Last data filed at 07/15/2024 0600 Gross per 24 hour  Intake 1610 ml  Output --  Net 1610 ml   Weight change:  Exam:  General:  Pt is alert, follows commands appropriately, not in acute distress HEENT: No icterus, No thrush, No neck mass, [place]/AT Cardiovascular: RRR, S1/S2, no rubs, no gallops Respiratory: CTA bilaterally, no wheezing, no crackles, no rhonchi Abdomen: Soft/+BS, non tender, non distended, no guarding Extremities: No edema, No lymphangitis, No petechiae, No rashes, no synovitis Neuro:  CN II-XII intact, strength 4/5 in RUE, RLE, strength 4/5 LUE, LLE; sensation intact bilateral; no dysmetria; babinski equivocal    Data Reviewed: I have personally reviewed following labs and imaging studies Basic Metabolic Panel: Recent Labs  Lab 07/13/24 2107 07/14/24 0111 07/14/24 0824 07/14/24 1310 07/15/24 0454  NA 138 136 136 134* 136  K 5.9* 5.6*  5.6* 5.4* 5.3* 4.9  CL 116* 114* 113* 110 112*  CO2 13* 14* 14* 17* 16*  GLUCOSE 159* 241* 246* 302* 275*  BUN 49* 47* 44* 44* 38*  CREATININE 1.75* 1.57* 1.63* 1.48* 1.50*  CALCIUM 12.4* 11.9* 11.4* 11.7* 10.4*  MG  --  2.1  --   --  1.9  PHOS  --  2.5  --   --   --    Liver Function Tests: Recent Labs  Lab 07/13/24 1433 07/14/24 0111 07/15/24 0454  AST 13* 12* <10*  ALT 13 11 9   ALKPHOS 141* 123 121  BILITOT 0.3 0.2 <0.2  PROT 7.1 6.1* 5.4*  ALBUMIN 4.1 3.6 3.2*   Recent Labs  Lab 07/13/24 1433  LIPASE 22   No results for input(s): AMMONIA in the last 168 hours. Coagulation Profile: No results for input(s): INR, PROTIME in the last 168 hours. CBC: Recent Labs  Lab 07/13/24 1433 07/14/24 0111 07/15/24 0454  WBC 5.3 5.5  4.5  HGB 10.3* 9.5* 8.3*  HCT 34.5* 30.4* 27.1*  MCV 89.1 86.6 86.9  PLT 234 223 200   Cardiac Enzymes: No results for input(s): CKTOTAL, CKMB, CKMBINDEX, TROPONINI in the last 168 hours. BNP: Invalid input(s): POCBNP CBG: Recent Labs  Lab 07/14/24 1708 07/14/24 1919 07/15/24 0725 07/15/24 1119 07/15/24 1610  GLUCAP 276* 245* 275* 177* 308*   HbA1C: Recent Labs    07/14/24 0111  HGBA1C 6.6*   Urine analysis:    Component Value Date/Time   COLORURINE YELLOW 07/13/2024 2335   APPEARANCEUR HAZY (A) 07/13/2024 2335   LABSPEC 1.011 07/13/2024 2335   PHURINE 5.0 07/13/2024 2335   GLUCOSEU 150 (A) 07/13/2024 2335   HGBUR NEGATIVE 07/13/2024 2335   BILIRUBINUR NEGATIVE 07/13/2024 2335   KETONESUR NEGATIVE 07/13/2024 2335   PROTEINUR NEGATIVE 07/13/2024 2335   NITRITE NEGATIVE 07/13/2024 2335   LEUKOCYTESUR LARGE (A) 07/13/2024 2335   Sepsis  Labs: @LABRCNTIP (procalcitonin:4,lacticidven:4) ) Recent Results (from the past 240 hours)  Urine Culture     Status: Abnormal (Preliminary result)   Collection Time: 07/13/24 11:35 PM   Specimen: Urine, Random  Result Value Ref Range Status   Specimen Description   Final    URINE, RANDOM Performed at [facility], [address] [place], [place] [address]    Special Requests   Final    NONE Reflexed from [id number] Performed at [facility], [address] [place], [place] [address]    Culture (A)  Final    >=100,000 COLONIES/mL KLEBSIELLA ORNITHINOLYTICA SUSCEPTIBILITIES TO FOLLOW Performed at [facility], [address] [place], [place] [address]    Report Status PENDING  Incomplete     Scheduled Meds:  amLODipine  5 mg Oral Daily   enoxaparin (LOVENOX) injection  30 mg Subcutaneous Q24H   insulin aspart  0-5 Units Subcutaneous QHS   insulin aspart  0-9 Units Subcutaneous TID WC   insulin glargine-yfgn  5 Units Subcutaneous QHS   pravastatin  10 mg Oral Daily   sodium bicarbonate  1,300 mg Oral BID    Continuous Infusions:  sodium chloride  100 mL/hr at 07/15/24 0953   cefTRIAXone (ROCEPHIN)  IV 1 g (07/15/24 1059)    Procedures/Studies: ECHOCARDIOGRAM COMPLETE Result Date: 07/14/2024    ECHOCARDIOGRAM REPORT   Patient Name:   GLENDIA OLSHEFSKI Date of Exam: 07/14/2024 Medical Rec #:  [id number]          Height:       66.0 in Accession #:    [id number]         Weight:       152.1 lb Date of Birth:  21-Jul-1941          BSA:          1.780 m Patient Age:    83 years           BP:           141/55 mmHg Patient Gender: F                  HR:           71 bpm. Exam Location:  [facility] Procedure: 2D Echo, Cardiac Doppler and Color Doppler (Both Spectral and Color            Flow Doppler were utilized during procedure). Indications:    Elevated Troponin  History:        Patient has no prior history of Echocardiogram examinations.                 Risk Factors:Hypertension and Diabetes.  Sonographer:    Aida Pizza RCS Referring Phys: [id number] Roselia Snipe IMPRESSIONS  1. Intra-cavitary gradient is 27 mm Hg. Left ventricular ejection fraction, by estimation, is >75%. The left ventricle has hyperdynamic function. The left ventricle has no regional wall motion abnormalities. There is mild left ventricular hypertrophy. Left ventricular diastolic parameters are consistent with Grade I diastolic dysfunction (impaired relaxation). Elevated left ventricular end-diastolic pressure.  2. Right ventricular systolic function is normal. The right ventricular size is normal. There is normal pulmonary artery systolic pressure.  3. The mitral valve is abnormal. Trivial mitral valve regurgitation. No evidence of mitral stenosis. Severe mitral annular calcification.  4. The aortic valve is tricuspid. Aortic valve regurgitation is mild. No aortic stenosis is present.  5. The inferior vena cava is normal in size with greater than 50% respiratory variability, suggesting  right atrial pressure of 3 mmHg.  6. Increased flow velocities  may be secondary to anemia, thyrotoxicosis, hyperdynamic or high flow state. Comparison(s): No prior Echocardiogram. FINDINGS  Left Ventricle: Left ventricular ejection fraction, by estimation, is >75%. The left ventricle has hyperdynamic function. The left ventricle has no regional wall motion abnormalities. Strain was performed and the global longitudinal strain is indeterminate. The left ventricular internal cavity size was normal in size. There is mild left ventricular hypertrophy. Left ventricular diastolic parameters are consistent with Grade I diastolic dysfunction (impaired relaxation). Elevated left ventricular end-diastolic pressure. Right Ventricle: The right ventricular size is normal. No increase in right ventricular wall thickness. Right ventricular systolic function is normal. There is normal pulmonary artery systolic pressure. The tricuspid regurgitant velocity is 2.85 m/s, and  with an assumed right atrial pressure of 3 mmHg, the estimated right ventricular systolic pressure is 35.5 mmHg. Left Atrium: Left atrial size was normal in size. Right Atrium: Right atrial size was normal in size. Pericardium: There is no evidence of pericardial effusion. Mitral Valve: The mitral valve is abnormal. Severe mitral annular calcification. Trivial mitral valve regurgitation. No evidence of mitral valve stenosis. Tricuspid Valve: The tricuspid valve is normal in structure. Tricuspid valve regurgitation is mild . No evidence of tricuspid stenosis. Aortic Valve: The aortic valve is tricuspid. Aortic valve regurgitation is mild. No aortic stenosis is present. Pulmonic Valve: The pulmonic valve was normal in structure. Pulmonic valve regurgitation is not visualized. No evidence of pulmonic stenosis. Aorta: The aortic root is normal in size and structure. Venous: The inferior vena cava is normal in size with greater than 50% respiratory variability, suggesting right atrial pressure of 3 mmHg. IAS/Shunts: No atrial  level shunt detected by color flow Doppler. Additional Comments: 3D was performed not requiring image post processing on an independent workstation and was indeterminate.  LEFT VENTRICLE PLAX 2D LVIDd:         4.10 cm   Diastology LVIDs:         1.90 cm   LV e' medial:    4.35 cm/s LV PW:         1.00 cm   LV E/e' medial:  19.5 LV IVS:        1.20 cm   LV e' lateral:   7.34 cm/s LVOT diam:     2.00 cm   LV E/e' lateral: 11.6 LV SV:         107 LV SV Index:   60 LVOT Area:     3.14 cm  RIGHT VENTRICLE RV S prime:     18.90 cm/s TAPSE (M-mode): 2.7 cm LEFT ATRIUM             Index        RIGHT ATRIUM           Index LA diam:        3.50 cm 1.97 cm/m   RA Area:     16.80 cm LA Vol (A2C):   40.8 ml 22.92 ml/m  RA Volume:   37.80 ml  21.23 ml/m LA Vol (A4C):   50.9 ml 28.59 ml/m LA Biplane Vol: 47.0 ml 26.40 ml/m  AORTIC VALVE LVOT Vmax:   139.00 cm/s LVOT Vmean:  94.100 cm/s LVOT VTI:    0.341 m  AORTA Ao Root diam: 3.80 cm MITRAL VALVE                TRICUSPID VALVE MV Area (PHT): 1.60 cm  TR Peak grad:   32.5 mmHg MV Decel Time: 475 msec     TR Vmax:        285.00 cm/s MV E velocity: 84.90 cm/s MV A velocity: 123.00 cm/s  SHUNTS MV E/A ratio:  0.69         Systemic VTI:  0.34 m                             Systemic Diam: 2.00 cm Vishnu Priya Mallipeddi Electronically signed by Diannah Late Mallipeddi Signature Date/Time: 07/14/2024/3:21:26 PM    Final    CT Renal Stone Study Result Date: 07/13/2024 CLINICAL DATA:  Abdominal pain. EXAM: CT ABDOMEN AND PELVIS WITHOUT CONTRAST TECHNIQUE: Multidetector CT imaging of the abdomen and pelvis was performed following the standard protocol without IV contrast. RADIATION DOSE REDUCTION: This exam was performed according to the departmental dose-optimization program which includes automated exposure control, adjustment of the mA and/or kV according to patient size and/or use of iterative reconstruction technique. COMPARISON:  None Available. FINDINGS: Evaluation of  this exam is limited in the absence of intravenous contrast as well as due to respiratory motion. Lower chest: The visualized lung bases are clear. No intra-abdominal free air or free fluid. Hepatobiliary: The liver is grossly unremarkable. Dilated cystic structure in the right upper abdomen may represent the gallbladder or a dilated bowel loop. No calcified gallstone. Surgical clips in the upper abdomen may be related to cholecystectomy. An air pocket in the right upper abdomen may represent air within the biliary tree. Pancreas: The pancreas is suboptimally evaluated and appears atrophic. No active inflammatory changes. Spleen: Normal in size without focal abnormality. Adrenals/Urinary Tract: The adrenal glands unremarkable. There is no hydronephrosis on either side. There is a 6 cm right renal inferior pole cyst as well as a rim calcified lesion in the inferior pole of the left kidney. The urinary bladder is grossly unremarkable. Stomach/Bowel: There is postsurgical changes of bowel with anastomotic staple line in the pelvis. No evidence of bowel obstruction. The appendix is not visualized with certainty. No inflammatory changes identified in the right lower quadrant. Vascular/Lymphatic: Advanced aortoiliac atherosclerotic disease. The IVC is unremarkable. No obvious adenopathy. Reproductive: The uterus is poorly visualized. Other: None Musculoskeletal: Osteopenia with degenerative changes of the spine. No acute osseous pathology. IMPRESSION: 1. Very limited exam due to severe respiratory motion and in the absence of intravenous contrast. No hydronephrosis or nephrolithiasis. 2. No evidence of bowel obstruction. 3.  Aortic Atherosclerosis (ICD10-I70.0). Electronically Signed   By: Vanetta Chou M.D.   On: 07/13/2024 19:14    Alm Schneider, DO  [facility]  If 7PM-7AM, please contact night-coverage [email or web address] Password [email or web address] 07/15/2024, 5:24 PM   LOS: 2 days

## 2024-07-15 NOTE — Plan of Care (Signed)

## 2024-07-16 DIAGNOSIS — N178 Other acute kidney failure: Secondary | ICD-10-CM | POA: Diagnosis not present

## 2024-07-16 DIAGNOSIS — E872 Acidosis, unspecified: Secondary | ICD-10-CM | POA: Diagnosis not present

## 2024-07-16 DIAGNOSIS — E1165 Type 2 diabetes mellitus with hyperglycemia: Secondary | ICD-10-CM | POA: Diagnosis not present

## 2024-07-16 DIAGNOSIS — N10 Acute tubulo-interstitial nephritis: Secondary | ICD-10-CM | POA: Diagnosis not present

## 2024-07-16 LAB — COMPREHENSIVE METABOLIC PANEL WITH GFR
ALT: 9 U/L (ref 0–44)
AST: 10 U/L — ABNORMAL LOW (ref 15–41)
Albumin: 3 g/dL — ABNORMAL LOW (ref 3.5–5.0)
Alkaline Phosphatase: 109 U/L (ref 38–126)
Anion gap: 7 (ref 5–15)
BUN: 29 mg/dL — ABNORMAL HIGH (ref 8–23)
CO2: 17 mmol/L — ABNORMAL LOW (ref 22–32)
Calcium: 9.7 mg/dL (ref 8.9–10.3)
Chloride: 112 mmol/L — ABNORMAL HIGH (ref 98–111)
Creatinine, Ser: 1.33 mg/dL — ABNORMAL HIGH (ref 0.44–1.00)
GFR, Estimated: 39 mL/min — ABNORMAL LOW (ref >60)
Glucose, Bld: 201 mg/dL — ABNORMAL HIGH (ref 70–99)
Potassium: 5.1 mmol/L (ref 3.5–5.1)
Sodium: 135 mmol/L (ref 135–145)
Total Bilirubin: <0.2 mg/dL (ref 0.0–1.2)
Total Protein: 5.2 g/dL — ABNORMAL LOW (ref 6.5–8.1)

## 2024-07-16 LAB — URINE CULTURE: Culture: >=100000 — AB

## 2024-07-16 LAB — GLUCOSE, CAPILLARY
Glucose-Capillary: 227 mg/dL — ABNORMAL HIGH (ref 70–99)
Glucose-Capillary: 274 mg/dL — ABNORMAL HIGH (ref 70–99)

## 2024-07-16 MED ORDER — CIPROFLOXACIN HCL 250 MG PO TABS: 500.0000 mg | ORAL_TABLET | Freq: Every day | ORAL | Status: DC

## 2024-07-16 MED ORDER — CIPROFLOXACIN HCL 500 MG PO TABS: 500.0000 mg | ORAL_TABLET | Freq: Every day | ORAL | 0 refills | Status: AC

## 2024-07-16 MED ORDER — SODIUM BICARBONATE 650 MG PO TABS: 1300.0000 mg | ORAL_TABLET | Freq: Two times a day (BID) | ORAL | 1 refills | Status: AC

## 2024-07-16 NOTE — Plan of Care (Signed)

## 2024-07-16 NOTE — TOC Transition Note (Signed)
 Transition of Care [facility]) - Discharge Note   Patient Details  Name: Jennifer Anthony MRN: [id number] Date of Birth: 15-Sep-1940  [facility] CM/SW Contact:  Sharlyne Stabs, RN Phone Number: 07/16/2024, 12:50 PM   Clinical Narrative:   Patient discharging home with [facility] home health. Orders placed, Cory updated.    Final next level of care: Home w Home Health Services Barriers to Discharge: Barriers Resolved   Patient Goals and CMS Choice Patient states their goals for this hospitalization and ongoing recovery are:: Return home CMS Medicare.gov Compare Post Acute Care list provided to:: Patient Choice offered to / list presented to : Patient     Discharge Placement                   Patient and family notified of of transfer: 07/16/24  Discharge Plan and Services Additional resources added to the After Visit Summary for   In-house Referral: Clinical Social Work Discharge Planning Services: CM Consult Post Acute Care Choice: Home Health                    HH Arranged: PT HH Agency: [facility]        Social Drivers of Health (SDOH) Interventions SDOH Screenings   Food Insecurity: No Food Insecurity (07/13/2024)  Housing: Low Risk  (07/13/2024)  Transportation Needs: No Transportation Needs (07/13/2024)  Utilities: Not At Risk (07/13/2024)  Depression (PHQ2-9): Low Risk  (07/05/2024)  Financial Resource Strain: Low Risk (08/10/2022)   Received from [facility]  Social Connections: Moderately Integrated (07/13/2024)  Tobacco Use: Medium Risk (07/14/2024)  Health Literacy: High Risk (04/28/2021)   Received from [facility]    Readmission Risk Interventions     No data to display

## 2024-07-16 NOTE — Discharge Summary (Signed)
 Physician Discharge Summary   Patient: Jennifer Anthony MRN: [id number] DOB: Dec 09, 1940  Admit date:     07/13/2024  Discharge date: 07/16/24  Discharge Physician: Jennifer Anthony   PCP: Jennifer Anthony   Recommendations at discharge:   Please follow up with primary care provider within 1-2 weeks  Please repeat BMP and CBC in one week  Please follow up with Jennifer Anthony in 2-4 weeks    Hospital Course: 83 year old female with a history of diabetes mellitus type 2, hypertension, CKD stage III, MGUS, and primary hyperparathyroidism presenting with generalized weakness, abdominal pain.  The patient states that she has been feeling generally unwell for about a week.  However her symptoms have progressed with worsening generalized weakness and nausea and vomiting on 07/13/2024.  She denies fevers, chills, chest pain, shortness breath, coughing, hemoptysis.  There is no hematochezia or melena.  She denies any dysuria or hematuria. The patient shows me AVS paperwork from 07/05/2024 that shows that one of her outpatient providers stopped Sensipar  and olmesartan.  She states that she lives by herself.  She drove to the emergency department on her own by private vehicle to seek medical care.  She does not use any assistive devices and is independent with her ADLs at baseline.  In the ED, the patient was afebrile hemodynamically stable with oxygen  saturation 100% room air.  WBC 5.3, hemoglobin 10.3, platelet 234.  Sodium 136, potassium 5.6, bicarbonate 14, serum creatinine 1.98.  LFTs were unremarkable.  Corrected calcium 12.4.  UA showed  >50 WBC. the patient was started IV fluids and ceftriaxone..   Assessment and Plan:  Acute on chronic renal failure--CKD stage IIIb - Baseline creatinine 1.4-1.6 - Patient presented with serum creatinine 1.98 - Secondary to volume depletion in the setting of hypercalcemia and poor oral intake - Continue IV fluids>> improving - serum creatinine 1.33 on day of dc    Hypercalcemia - The patient last saw Dr. JUDITHANN Anthony on 09/15/23 - Her hypercalcemia was felt to be multifactorial including primary hyperparathyroidism, CKD - Unclear if MGUS may be playing any role - Presented with corrected calcium 12.4 - Continue IV fluids>>improving - Reviewed the medical record shows that the patient has had persistent hypercalcemia ranging from 10.9-11.6 - Check PTH = 82 - Check PTH RP - restarted sensipar  - overall improved. 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 - Repeated Lokelma 11/14>>improved - Temporizing measures have been given - Monitor BMP - Continue IV fluids>>improved - pt is on veltassa daily at home>>restart   Diabetes mellitus type 2 with hyperglycemia - NovoLog sliding scale - Increase Semglee to 10 units - add novolog 3 units with meals - 11/14 hemoglobin A1c--6.6 - Holding Farxiga temporarily>>restart after d/c   NAGMA - 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   MGUS - Patient last followed up with Jennifer Anthony 03/20/2024 - At that time myeloma labs from 03/08/2024: M spike is 0.4  g.  FLC ratio is 3.07.  Immunofixation shows biclonal MGUS. -  skeletal survey from 03/08/2024: No lytic lesions. - She will need outpatient follow-up with hematology   Mixed hyperlipidemia - Continue statin   Essential hypertension - Continue amlodipine            Consultants: none Procedures performed: none  Disposition: Home Diet recommendation:  Renal diet DISCHARGE MEDICATION: Allergies as of 07/16/2024       Reactions   Ampicillin Swelling   Clindamycin/lincomycin         Medication List     TAKE these medications    amLODipine 5 MG tablet Commonly known as: NORVASC Take 5 mg by mouth daily.   aspirin 81 MG tablet Take 81 mg by mouth daily.   cinacalcet  30 MG tablet Commonly known as: SENSIPAR  Take 30 mg by mouth daily.   ciprofloxacin 500 MG tablet Commonly known as: CIPRO Take 1 tablet (500 mg total) by mouth daily with breakfast. Start taking on: July 17, 2024   Farxiga 10 MG Tabs tablet Generic drug: dapagliflozin propanediol Take 10 mg by mouth daily.   insulin lispro 100 UNIT/ML KwikPen Commonly known as: HUMALOG Inject 7 Units into the skin 3 (three) times daily as needed (Hyperglycemia).   Lantus SoloStar 100 UNIT/ML Solostar Pen Generic drug: insulin glargine Inject 16 Units into the skin at bedtime.   pravastatin 10 MG tablet Commonly known as: PRAVACHOL Take 10 mg by mouth daily.   sodium bicarbonate 650 MG tablet Take 2 tablets (1,300 mg total) by mouth 2 (two) times daily.   Veltassa 8.4 g packet Generic drug: patiromer Take 8.4 g by mouth daily.   Vitamin D-3 25 MCG (1000 UT) Caps Take 1 capsule by mouth daily.        Contact information for after-discharge care     Home Medical Care     [facility] .   Service: Home Health Services Contact information: [address] [place]  [address] [phone]                    Discharge Exam: Jennifer Anthony   07/13/24 1409  Weight: 69 kg   HEENT:  [place]/AT, No thrush, no icterus CV:  RRR, no rub, no S3, no S4 Lung:  CTA, no wheeze, no rhonchi Abd:  soft/+BS, NT Ext:  No edema, no lymphangitis, no synovitis, no rash   Condition at discharge: stable  The results of  significant diagnostics from this hospitalization (including imaging, microbiology, ancillary and laboratory) are listed below for reference.   Imaging Studies: ECHOCARDIOGRAM COMPLETE Result Date: 07/14/2024    ECHOCARDIOGRAM REPORT   Patient Name:   Jennifer Anthony Date of Exam: 07/14/2024 Medical Rec #:  [id number]          Height:       66.0 in Accession #:    [id number]         Weight:       152.1 lb Date of Birth:  Jul 25, 1941          BSA:          1.780 m Patient Age:    83 years           BP:           141/55 mmHg Patient Gender: F                  HR:  71 bpm. Exam Location:  [facility] Procedure: 2D Echo, Cardiac Doppler and Color Doppler (Both Spectral and Color            Flow Doppler were utilized during procedure). Indications:    Elevated Troponin  History:        Patient has no prior history of Echocardiogram examinations.                 Risk Factors:Hypertension and Diabetes.  Sonographer:    Aida Pizza RCS Referring Phys: [phone] Tionne Dayhoff IMPRESSIONS  1. Intra-cavitary gradient is 27 mm Hg. Left ventricular ejection fraction, by estimation, is >75%. The left ventricle has hyperdynamic function. The left ventricle has no regional wall motion abnormalities. There is mild left ventricular hypertrophy. Left ventricular diastolic parameters are consistent with Grade I diastolic dysfunction (impaired relaxation). Elevated left ventricular end-diastolic pressure.  2. Right ventricular systolic function is normal. The right ventricular size is normal. There is normal pulmonary artery systolic pressure.  3. The mitral valve is abnormal. Trivial mitral valve regurgitation. No evidence of mitral stenosis. Severe mitral annular calcification.  4. The aortic valve is tricuspid. Aortic valve regurgitation is mild. No aortic stenosis is present.  5. The inferior vena cava is normal in size with greater than 50% respiratory variability, suggesting right atrial pressure of 3 mmHg.  6. Increased  flow velocities may be secondary to anemia, thyrotoxicosis, hyperdynamic or high flow state. Comparison(s): No prior Echocardiogram. FINDINGS  Left Ventricle: Left ventricular ejection fraction, by estimation, is >75%. The left ventricle has hyperdynamic function. The left ventricle has no regional wall motion abnormalities. Strain was performed and the global longitudinal strain is indeterminate. The left ventricular internal cavity size was normal in size. There is mild left ventricular hypertrophy. Left ventricular diastolic parameters are consistent with Grade I diastolic dysfunction (impaired relaxation). Elevated left ventricular end-diastolic pressure. Right Ventricle: The right ventricular size is normal. No increase in right ventricular wall thickness. Right ventricular systolic function is normal. There is normal pulmonary artery systolic pressure. The tricuspid regurgitant velocity is 2.85 m/s, and  with an assumed right atrial pressure of 3 mmHg, the estimated right ventricular systolic pressure is 35.5 mmHg. Left Atrium: Left atrial size was normal in size. Right Atrium: Right atrial size was normal in size. Pericardium: There is no evidence of pericardial effusion. Mitral Valve: The mitral valve is abnormal. Severe mitral annular calcification. Trivial mitral valve regurgitation. No evidence of mitral valve stenosis. Tricuspid Valve: The tricuspid valve is normal in structure. Tricuspid valve regurgitation is mild . No evidence of tricuspid stenosis. Aortic Valve: The aortic valve is tricuspid. Aortic valve regurgitation is mild. No aortic stenosis is present. Pulmonic Valve: The pulmonic valve was normal in structure. Pulmonic valve regurgitation is not visualized. No evidence of pulmonic stenosis. Aorta: The aortic root is normal in size and structure. Venous: The inferior vena cava is normal in size with greater than 50% respiratory variability, suggesting right atrial pressure of 3 mmHg.  IAS/Shunts: No atrial level shunt detected by color flow Doppler. Additional Comments: 3D was performed not requiring image post processing on an independent workstation and was indeterminate.  LEFT VENTRICLE PLAX 2D LVIDd:         4.10 cm   Diastology LVIDs:         1.90 cm   LV e' medial:    4.35 cm/s LV PW:         1.00 cm   LV E/e' medial:  19.5 LV  IVS:        1.20 cm   LV e' lateral:   7.34 cm/s LVOT diam:     2.00 cm   LV E/e' lateral: 11.6 LV SV:         107 LV SV Index:   60 LVOT Area:     3.14 cm  RIGHT VENTRICLE RV S prime:     18.90 cm/s TAPSE (M-mode): 2.7 cm LEFT ATRIUM             Index        RIGHT ATRIUM           Index LA diam:        3.50 cm 1.97 cm/m   RA Area:     16.80 cm LA Vol (A2C):   40.8 ml 22.92 ml/m  RA Volume:   37.80 ml  21.23 ml/m LA Vol (A4C):   50.9 ml 28.59 ml/m LA Biplane Vol: 47.0 ml 26.40 ml/m  AORTIC VALVE LVOT Vmax:   139.00 cm/s LVOT Vmean:  94.100 cm/s LVOT VTI:    0.341 m  AORTA Ao Root diam: 3.80 cm MITRAL VALVE                TRICUSPID VALVE MV Area (PHT): 1.60 cm     TR Peak grad:   32.5 mmHg MV Decel Time: 475 msec     TR Vmax:        285.00 cm/s MV E velocity: 84.90 cm/s MV A velocity: 123.00 cm/s  SHUNTS MV E/A ratio:  0.69         Systemic VTI:  0.34 m                             Systemic Diam: 2.00 cm Vishnu Priya Mallipeddi Electronically signed by Diannah Late Mallipeddi Signature Date/Time: 07/14/2024/3:21:26 PM    Final    CT Renal Stone Study Result Date: 07/13/2024 CLINICAL DATA:  Abdominal pain. EXAM: CT ABDOMEN AND PELVIS WITHOUT CONTRAST TECHNIQUE: Multidetector CT imaging of the abdomen and pelvis was performed following the standard protocol without IV contrast. RADIATION DOSE REDUCTION: This exam was performed according to the departmental dose-optimization program which includes automated exposure control, adjustment of the mA and/or kV according to patient size and/or use of iterative reconstruction technique. COMPARISON:  None Available.  FINDINGS: Evaluation of this exam is limited in the absence of intravenous contrast as well as due to respiratory motion. Lower chest: The visualized lung bases are clear. No intra-abdominal free air or free fluid. Hepatobiliary: The liver is grossly unremarkable. Dilated cystic structure in the right upper abdomen may represent the gallbladder or a dilated bowel loop. No calcified gallstone. Surgical clips in the upper abdomen may be related to cholecystectomy. An air pocket in the right upper abdomen may represent air within the biliary tree. Pancreas: The pancreas is suboptimally evaluated and appears atrophic. No active inflammatory changes. Spleen: Normal in size without focal abnormality. Adrenals/Urinary Tract: The adrenal glands unremarkable. There is no hydronephrosis on either side. There is a 6 cm right renal inferior pole cyst as well as a rim calcified lesion in the inferior pole of the left kidney. The urinary bladder is grossly unremarkable. Stomach/Bowel: There is postsurgical changes of bowel with anastomotic staple line in the pelvis. No evidence of bowel obstruction. The appendix is not visualized with certainty. No inflammatory changes identified in the right lower quadrant. Vascular/Lymphatic: Advanced aortoiliac atherosclerotic disease. The  IVC is unremarkable. No obvious adenopathy. Reproductive: The uterus is poorly visualized. Other: None Musculoskeletal: Osteopenia with degenerative changes of the spine. No acute osseous pathology. IMPRESSION: 1. Very limited exam due to severe respiratory motion and in the absence of intravenous contrast. No hydronephrosis or nephrolithiasis. 2. No evidence of bowel obstruction. 3.  Aortic Atherosclerosis (ICD10-I70.0). Electronically Signed   By: Vanetta Chou M.D.   On: 07/13/2024 19:14    Microbiology: Results for orders placed or performed during the hospital encounter of 07/13/24  Urine Culture     Status: Abnormal   Collection Time: 07/13/24  11:35 PM   Specimen: Urine, Random  Result Value Ref Range Status   Specimen Description   Final    URINE, RANDOM Performed at [facility], [address], [place], [place] [address]    Special Requests   Final    NONE Reflexed from [id number] Performed at [facility], [address], [place], [place] [address]    Culture >=100,000 COLONIES/mL KLEBSIELLA ORNITHINOLYTICA (A)  Final   Report Status 07/16/2024 FINAL  Final   Organism ID, Bacteria KLEBSIELLA ORNITHINOLYTICA (A)  Final      Susceptibility   Klebsiella ornithinolytica - MIC*    AMPICILLIN >=32 RESISTANT Resistant     CEFEPIME <=0.12 SENSITIVE Sensitive     ERTAPENEM <=0.12 SENSITIVE Sensitive     CEFTRIAXONE <=0.25 SENSITIVE Sensitive     CIPROFLOXACIN <=0.06 SENSITIVE Sensitive     GENTAMICIN <=1 SENSITIVE Sensitive     NITROFURANTOIN 32 SENSITIVE Sensitive     TRIMETH/SULFA <=20 SENSITIVE Sensitive     AMPICILLIN/SULBACTAM <=2 SENSITIVE Sensitive     PIP/TAZO Value in next row Sensitive      <=4 SENSITIVEThis is a modified FDA-approved test that has been validated and its performance characteristics determined by the reporting laboratory.  This laboratory is certified under the Clinical Laboratory Improvement Amendments CLIA as qualified to perform high complexity clinical laboratory testing.    MEROPENEM Value in next row Sensitive      <=4 SENSITIVEThis is a modified FDA-approved test that has been validated and its performance characteristics determined by the reporting laboratory.  This laboratory is certified under the Clinical Laboratory Improvement Amendments CLIA as qualified to perform high complexity clinical laboratory testing.    * >=100,000 COLONIES/mL KLEBSIELLA ORNITHINOLYTICA    Labs: CBC: Recent Labs  Lab 07/13/24 1433 07/14/24 0111 07/15/24 0454  WBC 5.3 5.5 4.5  HGB 10.3* 9.5* 8.3*  HCT 34.5* 30.4* 27.1*  MCV 89.1 86.6 86.9  PLT 234 223 200   Basic Metabolic Panel: Recent Labs  Lab  07/14/24 0111 07/14/24 0824 07/14/24 1310 07/15/24 0454 07/16/24 0426  NA 136 136 134* 136 135  K 5.6*  5.6* 5.4* 5.3* 4.9 5.1  CL 114* 113* 110 112* 112*  CO2 14* 14* 17* 16* 17*  GLUCOSE 241* 246* 302* 275* 201*  BUN 47* 44* 44* 38* 29*  CREATININE 1.57* 1.63* 1.48* 1.50* 1.33*  CALCIUM 11.9* 11.4* 11.7* 10.4* 9.7  MG 2.1  --   --  1.9  --   PHOS 2.5  --   --   --   --    Liver Function Tests: Recent Labs  Lab 07/13/24 1433 07/14/24 0111 07/15/24 0454 07/16/24 0426  AST 13* 12* <10* 10*  ALT 13 11 9 9   ALKPHOS 141* 123 121 109  BILITOT 0.3 0.2 <0.2 <0.2  PROT 7.1 6.1* 5.4* 5.2*  ALBUMIN 4.1 3.6 3.2* 3.0*   CBG: Recent Labs  Lab 07/15/24 0725 07/15/24 1119 07/15/24 1610 07/15/24 1928 07/16/24 0722  GLUCAP 275* 177* 308* 267* 227*    Discharge time spent: greater than 30 minutes.  Signed: Alm Schneider, Anthony [facility] 07/16/2024

## 2024-07-16 NOTE — Plan of Care (Signed)

## 2024-07-24 LAB — PTH-RELATED PEPTIDE: PTH-related peptide: <2 pmol/L

## 2024-08-02 ENCOUNTER — Telehealth: Payer: Self-pay | Admitting: "Endocrinology

## 2024-08-02 NOTE — Telephone Encounter (Signed)
 Patient called the nurse line yesterday to schedule an appt. I called her back today and no answer no vm. If she calls back, she needs labs and an appt

## 2024-08-28 ENCOUNTER — Other Ambulatory Visit: Payer: Self-pay | Admitting: "Endocrinology

## 2024-08-28 ENCOUNTER — Telehealth: Payer: Self-pay | Admitting: "Endocrinology

## 2024-08-28 DIAGNOSIS — E213 Hyperparathyroidism, unspecified: Secondary | ICD-10-CM

## 2024-08-28 NOTE — Telephone Encounter (Signed)
 Can you put labs into [facility] instead of [facility]

## 2024-08-28 NOTE — Telephone Encounter (Signed)
 Pt called and stated that hospital stated she needed to get back in to see you for diabetes and calcium .  I have booked her for your next available on  10/12/24.  Does she need to be seen sooner?  She will also need a new lab order put into [facility].

## 2024-09-12 ENCOUNTER — Inpatient Hospital Stay

## 2024-09-12 NOTE — Telephone Encounter (Signed)
 Jennifer Anthony with [facility] called and said that the patient is having some complicated readings. She wants her to be seen. I scheduled her for tomorrow

## 2024-09-13 ENCOUNTER — Ambulatory Visit: Admitting: "Endocrinology

## 2024-09-13 ENCOUNTER — Encounter: Payer: Self-pay | Admitting: "Endocrinology

## 2024-09-13 VITALS — BP 110/58 | Resp 18 | Ht 66.0 in | Wt 143.4 lb

## 2024-09-13 DIAGNOSIS — Z794 Long term (current) use of insulin: Secondary | ICD-10-CM | POA: Diagnosis not present

## 2024-09-13 DIAGNOSIS — N183 Chronic kidney disease, stage 3 unspecified: Secondary | ICD-10-CM

## 2024-09-13 DIAGNOSIS — E213 Hyperparathyroidism, unspecified: Secondary | ICD-10-CM

## 2024-09-13 DIAGNOSIS — E1122 Type 2 diabetes mellitus with diabetic chronic kidney disease: Secondary | ICD-10-CM | POA: Diagnosis not present

## 2024-09-13 MED ORDER — LANTUS SOLOSTAR 100 UNIT/ML ~~LOC~~ SOPN: 20.0000 [IU] | PEN_INJECTOR | Freq: Every day | SUBCUTANEOUS | 1 refills | Status: AC

## 2024-09-13 NOTE — Progress Notes (Signed)
 "                                                     09/13/2024, 1:01 PM  Endocrinology follow-up note   Subjective:    Patient ID: Jennifer Anthony, female    DOB: February 21, 1941, PCP [name], MD   Past Medical History:  Diagnosis Date   Adenocarcinoma of colon (HCC)    CKD (chronic kidney disease), stage III (HCC)    Diabetes mellitus    Diabetes mellitus, type II (HCC)    Hyperkalemia    Hypertension    Hypertension    Osteoarthritis    Past Surgical History:  Procedure Laterality Date   ABDOMINAL HYSTERECTOMY     ANKLE FRACTURE SURGERY     CATARACT EXTRACTION, BILATERAL     COLON SURGERY     Social History   Socioeconomic History   Marital status: Widowed    Spouse name: Not on file   Number of children: Not on file   Years of education: Not on file   Highest education level: Not on file  Occupational History   Not on file  Tobacco Use   Smoking status: Former    Current packs/day: 0.00    Average packs/day: 0.5 packs/day for 20.0 years (10.0 ttl pk-yrs)    Types: Cigarettes    Start date: 23    Quit date: 2000    Years since quitting: 26.0   Smokeless tobacco: Never  Vaping Use   Vaping status: Never Used  Substance and Sexual Activity   Alcohol use: No   Drug use: No   Sexual activity: Not on file    Comment: widowed  Other Topics Concern   Not on file  Social History Narrative   Not on file   Social Drivers of Health   Tobacco Use: Medium Risk (09/13/2024)   Patient History    Smoking Tobacco Use: Former    Smokeless Tobacco Use: Never    Passive Exposure: Not on file  Financial Resource Strain: Low Risk (08/10/2022)   Received from [facility]   Overall Financial Resource Strain (CARDIA)    Difficulty of Paying Living Expenses: Not hard at all  Food Insecurity: No Food Insecurity (07/13/2024)   Epic    Worried About Radiation Protection Practitioner of Food in the Last Year: Never true    Ran Out of Food in the Last Year: Never true  Transportation  Needs: No Transportation Needs (07/13/2024)   Epic    Lack of Transportation (Medical): No    Lack of Transportation (Non-Medical): No  Physical Activity: Not on file  Stress: Not on file  Social Connections: Moderately Integrated (07/13/2024)   Social Connection and Isolation Panel    Frequency of Communication with Friends and Family: Three times a week    Frequency of Social Gatherings with Friends and Family: Three times a week    Attends Religious Services: More than 4 times per year    Active Member of Clubs or Organizations: No    Attends Banker Meetings: 1 to 4 times per year    Marital Status: Widowed  Depression (PHQ2-9): Low Risk (07/05/2024)   Depression (PHQ2-9)    PHQ-2 Score: 0  Alcohol Screen: Not on file  Housing: Low Risk (07/13/2024)   Epic    Unable to  Pay for Housing in the Last Year: No    Number of Times Moved in the Last Year: 1    Homeless in the Last Year: No  Utilities: Not At Risk (07/13/2024)   Epic    Threatened with loss of utilities: No  Health Literacy: Not on file   Family History  Problem Relation Age of Onset   Stroke Mother    Heart failure Mother    Cancer Father    Outpatient Encounter Medications as of 09/13/2024  Medication Sig   amLODipine  (NORVASC ) 5 MG tablet Take 5 mg by mouth daily.   aspirin 81 MG tablet Take 81 mg by mouth daily.     Cholecalciferol (VITAMIN D-3) 25 MCG (1000 UT) CAPS Take 1 capsule by mouth daily.   cinacalcet  (SENSIPAR ) 30 MG tablet Take 30 mg by mouth every other day.   ciprofloxacin  (CIPRO ) 500 MG tablet Take 1 tablet (500 mg total) by mouth daily with breakfast.   insulin  lispro (HUMALOG KWIKPEN) 100 UNIT/ML KwikPen Inject 5-8 Units into the skin 3 (three) times daily before meals.   pravastatin  (PRAVACHOL ) 10 MG tablet Take 10 mg by mouth daily.   sodium bicarbonate  650 MG tablet Take 2 tablets (1,300 mg total) by mouth 2 (two) times daily.   VELTASSA  8.4 g packet Take 8.4 g by mouth daily.    [DISCONTINUED] insulin  lispro (HUMALOG) 100 UNIT/ML KwikPen Inject 7 Units into the skin 3 (three) times daily as needed (Hyperglycemia). (Patient taking differently: Inject 12 Units into the skin 3 (three) times daily as needed (Hyperglycemia).)   FARXIGA 10 MG TABS tablet Take 10 mg by mouth daily. (Patient not taking: Reported on 09/13/2024)   LANTUS  SOLOSTAR 100 UNIT/ML Solostar Pen Inject 20 Units into the skin at bedtime.   [DISCONTINUED] LANTUS  SOLOSTAR 100 UNIT/ML Solostar Pen Inject 16 Units into the skin at bedtime. (Patient not taking: Inject 16 Units into the skin at bedtime.)   No facility-administered encounter medications on file as of 09/13/2024.   ALLERGIES: Allergies  Allergen Reactions   Ampicillin Swelling   Clindamycin/Lincomycin     VACCINATION STATUS:  There is no immunization history on file for this patient.  Hyperglycemia   Jennifer Anthony is 84 y.o. female who presents today with a medical history as above. she is being seen in follow-up after she was seen in consultation for hyperparathyroidism requested by [name], MD.  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 She continued to have mild to moderate hyperglycemia, which necessitated initiation of Sensipar  treatment.  She is currently on Sensipar  30 mg p.o. daily.  She was not seen over a year this clinic recent labs show calcium  of 9.7.  Patient  has CKD on nephrology care.  BUN 29, creatinine 1.33 with GFR of 39. She still did not have bone density.   She denies acute complaints today. She diabetes since her 30s, relates that she was told she has type 1 diabetes as opposed to type 2.  However, patient was on Farxiga in addition to her Lantus  during her last 2 visits.  More recently, she was advised by her PCP to discontinue Farxiga and continue only on insulin .   She is on Lantus  18 units nightly and Humalog 7-10 units 3 times daily  AC. Her Dexcom CGM shows 39% time in range, 35% level 1 hyperglycemia, 24% level 2 hyperglycemia.  Her average blood glucose 195 mg per DL for the most recent 14  days. Review of Systems  Constitutional: + Mildly fluctuating body weight,  + fatigue, no subjective hyperthermia, no subjective hypothermia Eyes: no blurry vision, no xerophthalmia   Objective:       09/13/2024   11:25 AM 07/16/2024    4:20 AM 07/15/2024    7:30 PM  Vitals with BMI  Height 5' 6    Weight 143 lbs 6 oz    BMI 23.16    Systolic 110 150 851  Diastolic 58 64 58  Pulse  80 68    BP (!) 110/58   Resp 18   Ht 5' 6 (1.676 m)   Wt 143 lb 6.4 oz (65 kg)   BMI 23.15 kg/m   Wt Readings from Last 3 Encounters:  09/13/24 143 lb 6.4 oz (65 kg)  07/13/24 152 lb 1.9 oz (69 kg)  07/05/24 154 lb (69.9 kg)    Physical Exam  Constitutional:  Body mass index is 23.15 kg/m.,  not in acute distress, normal state of mind Eyes: PERRLA, EOMI, no exophthalmos ENT: moist mucous membranes, no gross thyromegaly, no gross cervical lymphadenopathy     Latest Ref Rng & Units 07/16/2024    4:26 AM 07/15/2024    4:54 AM 07/14/2024    1:10 PM  CMP  Glucose 70 - 99 mg/dL 798  724  697   BUN 8 - 23 mg/dL 29  38  44   Creatinine 0.44 - 1.00 mg/dL 8.66  8.49  8.51   Sodium 135 - 145 mmol/L 135  136  134   Potassium 3.5 - 5.1 mmol/L 5.1  4.9  5.3   Chloride 98 - 111 mmol/L 112  112  110   CO2 22 - 32 mmol/L 17  16  17    Calcium  8.9 - 10.3 mg/dL 9.7  89.5  88.2   Total Protein 6.5 - 8.1 g/dL 5.2  5.4    Total Bilirubin 0.0 - 1.2 mg/dL <9.7  <9.7    Alkaline Phos 38 - 126 U/L 109  121    AST 15 - 41 U/L 10  <10    ALT 0 - 44 U/L 9  9        Latest Reference Range & Units 07/14/24 01:11 07/14/24 08:24 07/14/24 09:05  Glucose 70 - 99 mg/dL 758 (H) 753 (H)   Hemoglobin A1C 4.8 - 5.6 % 6.6 (H)    PTH, Intact 15 - 65 pg/mL 82 (H)    PTH-related peptide pmol/L   <2.0  TSH 0.350 - 4.500 uIU/mL   0.875  T4,Free(Direct)  0.61 - 1.12 ng/dL   9.19  (H): Data is abnormally high  Assessment & Plan:   1. Hyperparathyroidism (HCC) 2. Diabetes mellitus with stage 3 chronic kidney disease (HCC)   - I have reviewed her new and available endocrine records and clinically evaluated the patient. 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    She is responding to Sensipar  therapy with normalization of calcium  at  9.7, recent PTH of 82 on the background of CKD.  She is not a surgical candidate. In the interest of avoiding excessive suppression of PTH, she is advised to lower her Sensipar  to 30 mg p.o. every other day.    Her labs showed an undetectable PTH RP. - She is encouraged to maintain her appointment for bone density. Regarding her diabetes, which she now relates to be type I, treatment  will be exclusively with insulin .   Her recent A1c of 6.6%.  Her average blood glucose is discordant at 195 mg per DL for the next Priority will be avoiding hypoglycemia.  She is advised to continue Lantus  18 units nightly, lower Humalog to 5-8 units 3 times daily AC for Premeal blood glucose readings above 90 mg per DL.  She is advised to stay off of Farxiga for now.   She is benefiting from her CGM, advised to wear her Dexcom at all times.  - she acknowledges that there is a room for improvement in her food and drink choices. - Suggestion is made for her to avoid simple carbohydrates  from her diet including Cakes, Sweet Desserts, Ice Cream, Soda (diet and regular), Sweet Tea, Candies, Chips, Cookies, Store Bought Juices, Alcohol , Artificial Sweeteners,  Coffee Creamer, and Sugar-free Products, Lemonade. This will help patient to have more stable blood glucose profile and potentially avoid unintended weight gain.   - she is advised to maintain close follow up with [name], MD for primary care  needs.   I spent  25  minutes in the care of the patient today including review of labs from Thyroid  Function, CMP, and other relevant labs ; imaging/biopsy records (current and previous including abstractions from other facilities); face-to-face time discussing  her lab results and symptoms, medications doses, her options of short and long term treatment based on the latest standards of care / guidelines;   and documenting the encounter.  Jennifer Anthony  participated in the discussions, expressed understanding, and voiced agreement with the above plans.  All questions were answered to her satisfaction. she is encouraged to contact clinic should she have any questions or concerns prior to her return visit.  Dear Patient: Feel free to review your progress notes.  If you are reviewing this progress note and have questions about the meaning of /or medical terms being used, please make a note and address it at your next follow-up appointment.  Medical notes are meant to be a communication tool between medical professionals and require medical terms to be used for efficiency and insurance approval.    Follow up plan: Return in about 4 months (around 01/11/2025) for Bring Meter/CGM Device/Logs- A1c in Office.   Ranny Earl, MD [facility] [address] [place], [place] [address] Phone: [phone]  Fax: [phone]     09/13/2024, 1:01 PM  This note was partially dictated with voice recognition software. Similar sounding words can be transcribed inadequately or may not  be corrected upon review.  "

## 2024-09-19 ENCOUNTER — Inpatient Hospital Stay: Admitting: Oncology

## 2024-09-28 ENCOUNTER — Inpatient Hospital Stay: Attending: Hematology

## 2024-10-05 ENCOUNTER — Inpatient Hospital Stay: Admitting: Oncology

## 2024-10-12 ENCOUNTER — Ambulatory Visit: Admitting: "Endocrinology

## 2025-01-18 ENCOUNTER — Ambulatory Visit: Admitting: "Endocrinology
